# Patient Record
Sex: Male | Born: 1970 | Race: White | Hispanic: No | Marital: Married | Smoking: Never smoker
Health system: Southern US, Community
[De-identification: ages and names within clinical notes are randomized; demographics above are authoritative.]

---

## 2004-02-13 ENCOUNTER — Emergency Department (HOSPITAL_COMMUNITY): Admission: EM | Admit: 2004-02-13 | Discharge: 2004-02-13 | Payer: Self-pay | Admitting: Emergency Medicine

## 2019-12-16 ENCOUNTER — Ambulatory Visit (INDEPENDENT_AMBULATORY_CARE_PROVIDER_SITE_OTHER): Payer: Commercial Managed Care - PPO | Admitting: Family Medicine

## 2019-12-16 ENCOUNTER — Other Ambulatory Visit: Payer: Self-pay

## 2019-12-16 ENCOUNTER — Encounter: Payer: Self-pay | Admitting: Family Medicine

## 2019-12-16 VITALS — BP 118/76 | HR 95 | Temp 97.5°F | Ht 71.0 in | Wt 284.0 lb

## 2019-12-16 DIAGNOSIS — I10 Essential (primary) hypertension: Secondary | ICD-10-CM | POA: Diagnosis not present

## 2019-12-16 DIAGNOSIS — Z0000 Encounter for general adult medical examination without abnormal findings: Secondary | ICD-10-CM | POA: Insufficient documentation

## 2019-12-16 DIAGNOSIS — F320 Major depressive disorder, single episode, mild: Secondary | ICD-10-CM | POA: Diagnosis not present

## 2019-12-16 MED ORDER — LISINOPRIL-HYDROCHLOROTHIAZIDE 20-12.5 MG PO TABS: 1.0000 | ORAL_TABLET | Freq: Two times a day (BID) | ORAL | 3 refills | Status: DC

## 2019-12-16 MED ORDER — FLUOXETINE HCL 40 MG PO CAPS: 40.0000 mg | ORAL_CAPSULE | Freq: Two times a day (BID) | ORAL | 3 refills | Status: DC

## 2019-12-16 NOTE — Progress Notes (Signed)
Patient ID: Dakota Cohen, male    DOB: 11-29-1970, 49 y.o.   MRN: [id number]   Chief Complaint  Patient presents with  . Annual Exam   Subjective:    HPI  The patient comes in today for a wellness visit.    A review of their health history was completed.  A review of medications was also completed.  Any needed refills; prozac, lisinopril-hctz, and new cpap machine  Eating habits: health conscious  Falls/  MVA accidents in past few months: none  Regular exercise: strength 3 days a week, cardio 2 days a week   Specialist pt sees on regular basis: none  Preventative health issues were discussed.   Additional concerns: needs rx for new cpap machine to [place] [facility]   Medical History Dakota Cohen has no past medical history on file.   Outpatient Encounter Medications as of 12/16/2019  Medication Sig  . FLUoxetine (PROZAC) 40 MG capsule Take 1 capsule (40 mg total) by mouth 2 (two) times daily.  Marland Kitchen lisinopril-hydrochlorothiazide (ZESTORETIC) 20-12.5 MG tablet Take 1 tablet by mouth 2 (two) times daily.  . [DISCONTINUED] FLUoxetine (PROZAC) 40 MG capsule Take 40 mg by mouth 2 (two) times daily.  . [DISCONTINUED] lisinopril-hydrochlorothiazide (ZESTORETIC) 20-12.5 MG tablet Take 1 tablet by mouth 2 (two) times daily.   No facility-administered encounter medications on file as of 12/16/2019.     Review of Systems  Constitutional: Negative.   HENT: Negative.   Eyes: Negative.   Respiratory: Negative.        Sleep apnea- uses CPAP.  Cardiovascular: Negative.   Gastrointestinal: Negative.   Endocrine: Negative.   Genitourinary: Negative.   Musculoskeletal: Negative.   Skin: Negative.   Neurological: Negative.   Hematological: Negative.   Psychiatric/Behavioral: Negative.        High stress job.      Vitals BP 118/76   Pulse 95   Temp (!) 97.5 F (36.4 C)   Ht 5\' 11"  (1.803 m)   Wt 284 lb (128.8 kg)   SpO2 97%   BMI 39.61 kg/m   Objective:   Physical  Exam Vitals and nursing note reviewed. Exam conducted with a chaperone present.  Constitutional:      Appearance: Normal appearance.  HENT:     Right Ear: Tympanic membrane normal.     Left Ear: Tympanic membrane normal.  Eyes:     Pupils: Pupils are equal, round, and reactive to light.  Cardiovascular:     Rate and Rhythm: Normal rate and regular rhythm.     Heart sounds: Normal heart sounds.  Pulmonary:     Effort: Pulmonary effort is normal.     Breath sounds: Normal breath sounds.  Abdominal:     General: Bowel sounds are normal.     Palpations: Abdomen is soft.     Tenderness: There is no abdominal tenderness. There is no guarding.  Musculoskeletal:        General: Normal range of motion.  Skin:    General: Skin is warm and dry.  Neurological:     General: No focal deficit present.     Mental Status: She is alert and oriented to person, place, and time.      Assessment and Plan   1. Routine general medical examination at a health care facility - FLUoxetine (PROZAC) 40 MG capsule; Take 1 capsule (40 mg total) by mouth 2 (two) times daily.  Dispense: 60 capsule; Refill: 3 - lisinopril-hydrochlorothiazide (ZESTORETIC) 20-12.5 MG tablet; Take 1  tablet by mouth 2 (two) times daily.  Dispense: 60 tablet; Refill: 3  2. Essential hypertension - lisinopril-hydrochlorothiazide (ZESTORETIC) 20-12.5 MG tablet; Take 1 tablet by mouth 2 (two) times daily.  Dispense: 60 tablet; Refill: 3  3. Depression, major, single episode, mild (HCC) - FLUoxetine (PROZAC) 40 MG capsule; Take 1 capsule (40 mg total) by mouth 2 (two) times daily.  Dispense: 60 capsule; Refill: 3   Dakota Cohen presents today for a wellness exam. He has been managed by the NP at his job for his chronic conditions and he needs to start having his depression, hypertension, and sleep apnea managed by [facility].   He reports his job is high stress and his boss does not have boundaries, and unfortunately he has not been exercising  the past three months.   He brought lab work from his work and this was reviewed with him. The major concerns are his lipid profile.   He will continue to implement lifestyle changes with exercise and follow-up in 3 months. Diet and exercise discussed: information given.   PHQ-9: 9 today- he will continue on his Prozac (max dose) and be consistent with exercise. Blood pressure is well-controlled- he will continue his current regimen. His CPAP machine at home is very old and starting to fail: we will send over a new prescription to his pharmacy.  Goal is to try to lose at least 5% of his body weight through diet and exercise.   Agrees with plan of care discussed today. Understands warning signs to seek further care: any changes in your health that cause you concern, such as shortness of breath or chest pain. Understands to follow-up in 3 months for chronic condition follow-up and to see how successful lifestyle modifications are going.   Dorena Bodo, FNP-C  12/16/2019

## 2019-12-16 NOTE — Patient Instructions (Signed)
Mediterranean Diet A Mediterranean diet refers to food and lifestyle choices that are based on the traditions of countries located on the [place] Sea. This way of eating has been shown to help prevent certain conditions and improve outcomes for people who have chronic diseases, like kidney disease and heart disease. What are tips for following this plan? Lifestyle  Cook and eat meals together with your family, when possible.  Drink enough fluid to keep your urine clear or pale yellow.  Be physically active every day. This includes: ? Aerobic exercise like running or swimming. ? Leisure activities like gardening, walking, or housework.  Get 7-8 hours of sleep each night.  If recommended by your health care provider, drink red wine in moderation. This means 1 glass a day for nonpregnant women and 2 glasses a day for men. A glass of wine equals 5 oz (150 mL). Reading food labels  Check the serving size of packaged foods. For foods such as rice and pasta, the serving size refers to the amount of cooked product, not dry.  Check the total fat in packaged foods. Avoid foods that have saturated fat or trans fats.  Check the ingredients list for added sugars, such as corn syrup.   Shopping  At the grocery store, buy most of your food from the areas near the walls of the store. This includes: ? Fresh fruits and vegetables (produce). ? Grains, beans, nuts, and seeds. Some of these may be available in unpackaged forms or large amounts (in bulk). ? Fresh seafood. ? Poultry and eggs. ? Low-fat dairy products.  Buy whole ingredients instead of prepackaged foods.  Buy fresh fruits and vegetables in-season from local farmers markets.  Buy frozen fruits and vegetables in resealable bags.  If you do not have access to quality fresh seafood, buy precooked frozen shrimp or canned fish, such as tuna, salmon, or sardines.  Buy small amounts of raw or cooked vegetables, salads, or olives from  the deli or salad bar at your store.  Stock your pantry so you always have certain foods on hand, such as olive oil, canned tuna, canned tomatoes, rice, pasta, and beans. Cooking  Cook foods with extra-virgin olive oil instead of using butter or other vegetable oils.  Have meat as a side dish, and have vegetables or grains as your main dish. This means having meat in small portions or adding small amounts of meat to foods like pasta or stew.  Use beans or vegetables instead of meat in common dishes like chili or lasagna.  Experiment with different cooking methods. Try roasting or broiling vegetables instead of steaming or sauteing them.  Add frozen vegetables to soups, stews, pasta, or rice.  Add nuts or seeds for added healthy fat at each meal. You can add these to yogurt, salads, or vegetable dishes.  Marinate fish or vegetables using olive oil, lemon juice, garlic, and fresh herbs. Meal planning  Plan to eat 1 vegetarian meal one day each week. Try to work up to 2 vegetarian meals, if possible.  Eat seafood 2 or more times a week.  Have healthy snacks readily available, such as: ? Vegetable sticks with hummus. ? Greek yogurt. ? Fruit and nut trail mix.  Eat balanced meals throughout the week. This includes: ? Fruit: 2-3 servings a day ? Vegetables: 4-5 servings a day ? Low-fat dairy: 2 servings a day ? Fish, poultry, or lean meat: 1 serving a day ? Beans and legumes: 2 or more servings a week ?   Nuts and seeds: 1-2 servings a day ? Whole grains: 6-8 servings a day ? Extra-virgin olive oil: 3-4 servings a day  Limit red meat and sweets to only a few servings a month   What are my food choices?  Mediterranean diet ? Recommended  Grains: Whole-grain pasta. Brown rice. Bulgar wheat. Polenta. Couscous. Whole-wheat bread. Oatmeal. Quinoa.  Vegetables: Artichokes. Beets. Broccoli. Cabbage. Carrots. Eggplant. Green beans. Chard. Kale. Spinach. Onions. Leeks. Peas. Squash.  Tomatoes. Peppers. Radishes.  Fruits: Apples. Apricots. Avocado. Berries. Bananas. Cherries. Dates. Figs. Grapes. Lemons. Melon. Oranges. Peaches. Plums. Pomegranate.  Meats and other protein foods: Beans. Almonds. Sunflower seeds. Pine nuts. Peanuts. Cod. Salmon. Scallops. Shrimp. Tuna. Tilapia. Clams. Oysters. Eggs.  Dairy: Low-fat milk. Cheese. Greek yogurt.  Beverages: Water. Red wine. Herbal tea.  Fats and oils: Extra virgin olive oil. Avocado oil. Grape seed oil.  Sweets and desserts: Greek yogurt with honey. Baked apples. Poached pears. Trail mix.  Seasoning and other foods: Basil. Cilantro. Coriander. Cumin. Mint. Parsley. Sage. Rosemary. Tarragon. Garlic. Oregano. Thyme. Pepper. Balsalmic vinegar. Tahini. Hummus. Tomato sauce. Olives. Mushrooms. ? Limit these  Grains: Prepackaged pasta or rice dishes. Prepackaged cereal with added sugar.  Vegetables: Deep fried potatoes (french fries).  Fruits: Fruit canned in syrup.  Meats and other protein foods: Beef. Pork. Lamb. Poultry with skin. Hot dogs. Bacon.  Dairy: Ice cream. Sour cream. Whole milk.  Beverages: Juice. Sugar-sweetened soft drinks. Beer. Liquor and spirits.  Fats and oils: Butter. Canola oil. Vegetable oil. Beef fat (tallow). Lard.  Sweets and desserts: Cookies. Cakes. Pies. Candy.  Seasoning and other foods: Mayonnaise. Premade sauces and marinades. The items listed may not be a complete list. Talk with your dietitian about what dietary choices are right for you. Summary  The Mediterranean diet includes both food and lifestyle choices.  Eat a variety of fresh fruits and vegetables, beans, nuts, seeds, and whole grains.  Limit the amount of red meat and sweets that you eat.  Talk with your health care provider about whether it is safe for you to drink red wine in moderation. This means 1 glass a day for nonpregnant women and 2 glasses a day for men. A glass of wine equals 5 oz (150 mL). This information  is not intended to replace advice given to you by your health care provider. Make sure you discuss any questions you have with your health care provider. Document Revised: 11/24/2015 Document Reviewed: 11/17/2015 Elsevier Patient Education  2020 Else[employer] Inc.  

## 2019-12-29 ENCOUNTER — Telehealth: Payer: Self-pay | Admitting: *Deleted

## 2019-12-29 NOTE — Telephone Encounter (Signed)
Patient called stating he is needing a new rx for Cpap and supplies. He states he had a sleep study done about 15 years or so ago and would rather not go through all of that again- he has been paying out of pocket for the supplies and is now needing the machine. [employer].

## 2019-12-29 NOTE — Telephone Encounter (Signed)
1.  Very unlikely it would be covered by insurance.  Most insurance companies want a up-to-date sleep study something that is been done in the past few years. We can write it but typically what I would do in this situation is recommend a new up-to-date sleep study (And also typically office visit to do this or in some cases I would order the sleep study then recommend office visit once his sleep study comes in)

## 2019-12-29 NOTE — Telephone Encounter (Signed)
Can we order this w/o the sleep study?  Thx. Dr. Ladona Ridgel

## 2019-12-29 NOTE — Telephone Encounter (Signed)
Please see note from Dr. Lorin Picket.  Likely won't get covered unless new sleep study.  Does he want to schedule sleep study?  Thx. Dr. Ladona Ridgel

## 2019-12-29 NOTE — Telephone Encounter (Signed)
See below

## 2019-12-29 NOTE — Telephone Encounter (Signed)
This is Autumn's brother so she couldn't open the message but we can always send a script for it, it just will more than likely not be covered by insurance.

## 2019-12-29 NOTE — Telephone Encounter (Signed)
Hi, are we able to get cpap machine/supplies if no record of sleep study from  10-15 yrs ago?  Thx. shelly

## 2019-12-30 NOTE — Telephone Encounter (Signed)
Patient would prefer to get script without sleep study at this point in time. Script on your door to be signed.

## 2019-12-31 NOTE — Telephone Encounter (Signed)
done

## 2019-12-31 NOTE — Telephone Encounter (Signed)
Faxed

## 2020-01-18 NOTE — Telephone Encounter (Signed)
Error

## 2020-03-16 ENCOUNTER — Other Ambulatory Visit: Payer: Self-pay

## 2020-03-16 ENCOUNTER — Ambulatory Visit (INDEPENDENT_AMBULATORY_CARE_PROVIDER_SITE_OTHER): Payer: Commercial Managed Care - PPO | Admitting: Family Medicine

## 2020-03-16 ENCOUNTER — Encounter: Payer: Self-pay | Admitting: Family Medicine

## 2020-03-16 VITALS — BP 136/74 | HR 104 | Temp 97.7°F | Wt 291.8 lb

## 2020-03-16 DIAGNOSIS — F320 Major depressive disorder, single episode, mild: Secondary | ICD-10-CM

## 2020-03-16 DIAGNOSIS — I10 Essential (primary) hypertension: Secondary | ICD-10-CM

## 2020-03-16 DIAGNOSIS — Z0000 Encounter for general adult medical examination without abnormal findings: Secondary | ICD-10-CM | POA: Diagnosis not present

## 2020-03-16 MED ORDER — FLUOXETINE HCL 40 MG PO CAPS: 40.0000 mg | ORAL_CAPSULE | Freq: Two times a day (BID) | ORAL | 5 refills | Status: DC

## 2020-03-16 MED ORDER — LISINOPRIL-HYDROCHLOROTHIAZIDE 20-12.5 MG PO TABS: 1.0000 | ORAL_TABLET | Freq: Two times a day (BID) | ORAL | 5 refills | Status: DC

## 2020-03-16 NOTE — Progress Notes (Signed)
Patient ID: Dakota Cohen, male    DOB: October 02, 1970, 49 y.o.   MRN: [id number]   Chief Complaint  Patient presents with  . Hypertension  . Depression   Subjective:  CC: medication management for high blood pressure and depression   Reports today for routine medication management for hypertension and depression.  Reports things are going well, no health concerns, has a stressful job.  Denies any thoughts of harm.  Denies fever, chills, chest pain, shortness of breath.    Medical History Bird has no past medical history on file.   Outpatient Encounter Medications as of 03/16/2020  Medication Sig  . FLUoxetine (PROZAC) 40 MG capsule Take 1 capsule (40 mg total) by mouth 2 (two) times daily.  Marland Kitchen lisinopril-hydrochlorothiazide (ZESTORETIC) 20-12.5 MG tablet Take 1 tablet by mouth 2 (two) times daily.  . [DISCONTINUED] FLUoxetine (PROZAC) 40 MG capsule Take 1 capsule (40 mg total) by mouth 2 (two) times daily.  . [DISCONTINUED] lisinopril-hydrochlorothiazide (ZESTORETIC) 20-12.5 MG tablet Take 1 tablet by mouth 2 (two) times daily.   No facility-administered encounter medications on file as of 03/16/2020.     Review of Systems  Constitutional: Positive for fatigue. Negative for chills and fever.       Usually fatigue than normal.  HENT: Negative for ear pain.   Respiratory: Negative for cough, chest tightness and shortness of breath.   Cardiovascular: Negative for chest pain, palpitations and leg swelling.  Gastrointestinal: Negative for abdominal pain and blood in stool.  Genitourinary: Negative for difficulty urinating.  Musculoskeletal: Negative for back pain and joint swelling.  Skin: Negative for rash.  Neurological: Negative for dizziness, weakness, numbness and headaches.  Psychiatric/Behavioral: Negative for self-injury and suicidal ideas.     Vitals BP 136/74   Pulse (!) 104   Temp 97.7 F (36.5 C)   Wt 291 lb 12.8 oz (132.4 kg)   SpO2 98%   BMI 40.70 kg/m    Objective:   Physical Exam Vitals and nursing note reviewed.  Constitutional:      Appearance: Normal appearance.  Cardiovascular:     Rate and Rhythm: Normal rate and regular rhythm.     Heart sounds: Normal heart sounds.  Pulmonary:     Effort: Pulmonary effort is normal.     Breath sounds: Normal breath sounds.  Skin:    General: Skin is warm and dry.  Neurological:     Mental Status: He is alert and oriented to person, place, and time.  Psychiatric:        Mood and Affect: Mood normal.        Behavior: Behavior normal.        Thought Content: Thought content normal.      Assessment and Plan   1. Essential hypertension - lisinopril-hydrochlorothiazide (ZESTORETIC) 20-12.5 MG tablet; Take 1 tablet by mouth 2 (two) times daily.  Dispense: 60 tablet; Refill: 5  2. Depression, major, single episode, mild (HCC) - FLUoxetine (PROZAC) 40 MG capsule; Take 1 capsule (40 mg total) by mouth 2 (two) times daily.  Dispense: 60 capsule; Refill: 5  3. Routine general medical examination at a health care facility - lisinopril-hydrochlorothiazide (ZESTORETIC) 20-12.5 MG tablet; Take 1 tablet by mouth 2 (two) times daily.  Dispense: 60 tablet; Refill: 5 - FLUoxetine (PROZAC) 40 MG capsule; Take 1 capsule (40 mg total) by mouth 2 (two) times daily.  Dispense: 60 capsule; Refill: 5   Essential hypertension: Encouraged sodium restriction, exercise, lots of fruits and vegetables.  Compliant  with medication.  Continue current medication regimen.  Depression: PHQ-9 score today 3.  Feels well controlled, no changes to current medication regimen.  Denies any thoughts of self-harm.  Last laboratory work done in August 2021 overall all good.  We will continue to work on weight loss, getting adequate exercise.  Has a stressful occupation, encouraged boundaries, this is not likely.  Agrees with plan of care discussed today. Understands warning signs to seek further care: Chest pain, shortness of  breath, any significant change in health. Understands to follow-up in 6 months for medication management for hypertension and depression, sooner if needed.  Routine medication refills sent.   Dorena Bodo, FNP-C

## 2020-03-16 NOTE — Patient Instructions (Signed)
DASH Eating Plan DASH stands for "Dietary Approaches to Stop Hypertension." The DASH eating plan is a healthy eating plan that has been shown to reduce high blood pressure (hypertension). It may also reduce your risk for type 2 diabetes, heart disease, and stroke. The DASH eating plan may also help with weight loss. What are tips for following this plan?  General guidelines  Avoid eating more than 2,300 mg (milligrams) of salt (sodium) a day. If you have hypertension, you may need to reduce your sodium intake to 1,500 mg a day.  Limit alcohol intake to no more than 1 drink a day for nonpregnant women and 2 drinks a day for men. One drink equals 12 oz of beer, 5 oz of wine, or 1 oz of hard liquor.  Work with your health care provider to maintain a healthy body weight or to lose weight. Ask what an ideal weight is for you.  Get at least 30 minutes of exercise that causes your heart to beat faster (aerobic exercise) most days of the week. Activities may include walking, swimming, or biking.  Work with your health care provider or diet and nutrition specialist (dietitian) to adjust your eating plan to your individual calorie needs. Reading food labels   Check food labels for the amount of sodium per serving. Choose foods with less than 5 percent of the Daily Value of sodium. Generally, foods with less than 300 mg of sodium per serving fit into this eating plan.  To find whole grains, look for the word "whole" as the first word in the ingredient list. Shopping  Buy products labeled as "low-sodium" or "no salt added."  Buy fresh foods. Avoid canned foods and premade or frozen meals. Cooking  Avoid adding salt when cooking. Use salt-free seasonings or herbs instead of table salt or sea salt. Check with your health care provider or pharmacist before using salt substitutes.  Do not fry foods. Cook foods using healthy methods such as baking, boiling, grilling, and broiling instead.  Cook with  heart-healthy oils, such as olive, canola, soybean, or sunflower oil. Meal planning  Eat a balanced diet that includes: ? 5 or more servings of fruits and vegetables each day. At each meal, try to fill half of your plate with fruits and vegetables. ? Up to 6-8 servings of whole grains each day. ? Less than 6 oz of lean meat, poultry, or fish each day. A 3-oz serving of meat is about the same size as a deck of cards. One egg equals 1 oz. ? 2 servings of low-fat dairy each day. ? A serving of nuts, seeds, or beans 5 times each week. ? Heart-healthy fats. Healthy fats called Omega-3 fatty acids are found in foods such as flaxseeds and coldwater fish, like sardines, salmon, and mackerel.  Limit how much you eat of the following: ? Canned or prepackaged foods. ? Food that is high in trans fat, such as fried foods. ? Food that is high in saturated fat, such as fatty meat. ? Sweets, desserts, sugary drinks, and other foods with added sugar. ? Full-fat dairy products.  Do not salt foods before eating.  Try to eat at least 2 vegetarian meals each week.  Eat more home-cooked food and less restaurant, buffet, and fast food.  When eating at a restaurant, ask that your food be prepared with less salt or no salt, if possible. What foods are recommended? The items listed may not be a complete list. Talk with your dietitian about   what dietary choices are best for you. Grains Whole-grain or whole-wheat bread. Whole-grain or whole-wheat pasta. Brown rice. Oatmeal. Quinoa. Bulgur. Whole-grain and low-sodium cereals. Pita bread. Low-fat, low-sodium crackers. Whole-wheat flour tortillas. Vegetables Fresh or frozen vegetables (raw, steamed, roasted, or grilled). Low-sodium or reduced-sodium tomato and vegetable juice. Low-sodium or reduced-sodium tomato sauce and tomato paste. Low-sodium or reduced-sodium canned vegetables. Fruits All fresh, dried, or frozen fruit. Canned fruit in natural juice (without  added sugar). Meat and other protein foods Skinless chicken or turkey. Ground chicken or turkey. Pork with fat trimmed off. Fish and seafood. Egg whites. Dried beans, peas, or lentils. Unsalted nuts, nut butters, and seeds. Unsalted canned beans. Lean cuts of beef with fat trimmed off. Low-sodium, lean deli meat. Dairy Low-fat (1%) or fat-free (skim) milk. Fat-free, low-fat, or reduced-fat cheeses. Nonfat, low-sodium ricotta or cottage cheese. Low-fat or nonfat yogurt. Low-fat, low-sodium cheese. Fats and oils Soft margarine without trans fats. Vegetable oil. Low-fat, reduced-fat, or light mayonnaise and salad dressings (reduced-sodium). Canola, safflower, olive, soybean, and sunflower oils. Avocado. Seasoning and other foods Herbs. Spices. Seasoning mixes without salt. Unsalted popcorn and pretzels. Fat-free sweets. What foods are not recommended? The items listed may not be a complete list. Talk with your dietitian about what dietary choices are best for you. Grains Baked goods made with fat, such as croissants, muffins, or some breads. Dry pasta or rice meal packs. Vegetables Creamed or fried vegetables. Vegetables in a cheese sauce. Regular canned vegetables (not low-sodium or reduced-sodium). Regular canned tomato sauce and paste (not low-sodium or reduced-sodium). Regular tomato and vegetable juice (not low-sodium or reduced-sodium). Pickles. Olives. Fruits Canned fruit in a light or heavy syrup. Fried fruit. Fruit in cream or butter sauce. Meat and other protein foods Fatty cuts of meat. Ribs. Fried meat. Bacon. Sausage. Bologna and other processed lunch meats. Salami. Fatback. Hotdogs. Bratwurst. Salted nuts and seeds. Canned beans with added salt. Canned or smoked fish. Whole eggs or egg yolks. Chicken or turkey with skin. Dairy Whole or 2% milk, cream, and half-and-half. Whole or full-fat cream cheese. Whole-fat or sweetened yogurt. Full-fat cheese. Nondairy creamers. Whipped toppings.  Processed cheese and cheese spreads. Fats and oils Butter. Stick margarine. Lard. Shortening. Ghee. Bacon fat. Tropical oils, such as coconut, palm kernel, or palm oil. Seasoning and other foods Salted popcorn and pretzels. Onion salt, garlic salt, seasoned salt, table salt, and sea salt. Worcestershire sauce. Tartar sauce. Barbecue sauce. Teriyaki sauce. Soy sauce, including reduced-sodium. Steak sauce. Canned and packaged gravies. Fish sauce. Oyster sauce. Cocktail sauce. Horseradish that you find on the shelf. Ketchup. Mustard. Meat flavorings and tenderizers. Bouillon cubes. Hot sauce and Tabasco sauce. Premade or packaged marinades. Premade or packaged taco seasonings. Relishes. Regular salad dressings. Where to find more information:  National Heart, Lung, and Blood Institute: www.nhlbi.nih.gov  American Heart Association: www.heart.org Summary  The DASH eating plan is a healthy eating plan that has been shown to reduce high blood pressure (hypertension). It may also reduce your risk for type 2 diabetes, heart disease, and stroke.  With the DASH eating plan, you should limit salt (sodium) intake to 2,300 mg a day. If you have hypertension, you may need to reduce your sodium intake to 1,500 mg a day.  When on the DASH eating plan, aim to eat more fresh fruits and vegetables, whole grains, lean proteins, low-fat dairy, and heart-healthy fats.  Work with your health care provider or diet and nutrition specialist (dietitian) to adjust your eating plan to your   individual calorie needs. This information is not intended to replace advice given to you by your health care provider. Make sure you discuss any questions you have with your health care provider. Document Revised: 03/08/2017 Document Reviewed: 03/19/2016 Elsevier Patient Education  2020 Elsevier Inc.  

## 2020-09-14 ENCOUNTER — Ambulatory Visit: Payer: Commercial Managed Care - PPO | Admitting: Family Medicine

## 2020-09-26 ENCOUNTER — Other Ambulatory Visit: Payer: Self-pay | Admitting: *Deleted

## 2020-09-26 DIAGNOSIS — I10 Essential (primary) hypertension: Secondary | ICD-10-CM

## 2020-09-26 DIAGNOSIS — Z0000 Encounter for general adult medical examination without abnormal findings: Secondary | ICD-10-CM

## 2020-09-26 DIAGNOSIS — F320 Major depressive disorder, single episode, mild: Secondary | ICD-10-CM

## 2020-09-26 MED ORDER — FLUOXETINE HCL 40 MG PO CAPS: 40.0000 mg | ORAL_CAPSULE | Freq: Two times a day (BID) | ORAL | 0 refills | Status: DC

## 2020-09-26 MED ORDER — LISINOPRIL-HYDROCHLOROTHIAZIDE 20-12.5 MG PO TABS: 1.0000 | ORAL_TABLET | Freq: Two times a day (BID) | ORAL | 0 refills | Status: DC

## 2020-09-27 ENCOUNTER — Other Ambulatory Visit: Payer: Self-pay | Admitting: Family Medicine

## 2020-09-27 DIAGNOSIS — F320 Major depressive disorder, single episode, mild: Secondary | ICD-10-CM

## 2020-09-27 DIAGNOSIS — Z0000 Encounter for general adult medical examination without abnormal findings: Secondary | ICD-10-CM

## 2020-10-12 ENCOUNTER — Other Ambulatory Visit: Payer: Self-pay

## 2020-10-12 ENCOUNTER — Encounter: Payer: Self-pay | Admitting: Family Medicine

## 2020-10-12 ENCOUNTER — Ambulatory Visit (INDEPENDENT_AMBULATORY_CARE_PROVIDER_SITE_OTHER): Payer: Commercial Managed Care - PPO | Admitting: Family Medicine

## 2020-10-12 VITALS — BP 121/80 | HR 86 | Temp 98.2°F | Ht 71.0 in | Wt 270.0 lb

## 2020-10-12 DIAGNOSIS — I10 Essential (primary) hypertension: Secondary | ICD-10-CM

## 2020-10-12 DIAGNOSIS — Z0001 Encounter for general adult medical examination with abnormal findings: Secondary | ICD-10-CM | POA: Diagnosis not present

## 2020-10-12 DIAGNOSIS — F320 Major depressive disorder, single episode, mild: Secondary | ICD-10-CM

## 2020-10-12 DIAGNOSIS — Z0000 Encounter for general adult medical examination without abnormal findings: Secondary | ICD-10-CM

## 2020-10-12 DIAGNOSIS — E782 Mixed hyperlipidemia: Secondary | ICD-10-CM

## 2020-10-12 MED ORDER — FLUOXETINE HCL 40 MG PO CAPS: 40.0000 mg | ORAL_CAPSULE | Freq: Two times a day (BID) | ORAL | 5 refills | Status: DC

## 2020-10-12 MED ORDER — LISINOPRIL-HYDROCHLOROTHIAZIDE 20-12.5 MG PO TABS: 1.0000 | ORAL_TABLET | Freq: Two times a day (BID) | ORAL | 5 refills | Status: DC

## 2020-10-12 MED ORDER — BUPROPION HCL ER (XL) 150 MG PO TB24: 150.0000 mg | ORAL_TABLET | Freq: Every day | ORAL | 0 refills | Status: DC

## 2020-10-12 NOTE — Progress Notes (Signed)
Patient ID: BELL CAI, male    DOB: Sep 15, 1970, 50 y.o.   MRN: [id number]   Chief Complaint  Patient presents with   Annual Exam   Subjective:    HPI The patient comes in today for a wellness visit.  A review of their health history was completed.  A review of medications was also completed.  Any needed refills; yes zestoric and other  Eating habits: fair  Falls/  MVA accidents in past few months: no  Regular exercise: 3 x weekly  Specialist pt sees on regular basis: none  Preventative health issues were discussed.   Additional concerns: anx/ dep med not effective  Taking prozac 40mg  bid and was on wellbutrin in past.  Pt on cpap machine has h/o sleep apnea. Had machine over 10 yrs. Depress and anxiety- feeling meds aren't as effective- Prozac 40mg  bid.  Medical History Dakota Cohen has no past medical history on file.   Outpatient Encounter Medications as of 10/12/2020  Medication Sig   buPROPion (WELLBUTRIN XL) 150 MG 24 hr tablet Take 1 tablet (150 mg total) by mouth daily.   FLUoxetine (PROZAC) 40 MG capsule Take 1 capsule (40 mg total) by mouth 2 (two) times daily.   lisinopril-hydrochlorothiazide (ZESTORETIC) 20-12.5 MG tablet Take 1 tablet by mouth 2 (two) times daily.   [DISCONTINUED] FLUoxetine (PROZAC) 40 MG capsule Take 1 capsule (40 mg total) by mouth 2 (two) times daily.   [DISCONTINUED] lisinopril-hydrochlorothiazide (ZESTORETIC) 20-12.5 MG tablet Take 1 tablet by mouth 2 (two) times daily.   No facility-administered encounter medications on file as of 10/12/2020.     Review of Systems  Constitutional:  Negative for chills and fever.  HENT:  Negative for congestion, rhinorrhea and sore throat.   Respiratory:  Negative for cough, shortness of breath and wheezing.   Cardiovascular:  Negative for chest pain and leg swelling.  Gastrointestinal:  Negative for abdominal pain, diarrhea, nausea and vomiting.  Genitourinary:  Negative for dysuria and  frequency.  Skin:  Negative for rash.  Neurological:  Negative for dizziness, weakness and headaches.  Psychiatric/Behavioral:  Positive for dysphoric mood. Negative for self-injury, sleep disturbance and suicidal ideas. The patient is not nervous/anxious.     Vitals BP 121/80   Pulse 86   Temp 98.2 F (36.8 C)   Ht 5\' 11"  (1.803 m)   Wt 270 lb (122.5 kg)   SpO2 97%   BMI 37.66 kg/m   Objective:   Physical Exam Vitals and nursing note reviewed.  Constitutional:      General: He is not in acute distress.    Appearance: Normal appearance. He is not ill-appearing.  HENT:     Head: Normocephalic.     Nose: Nose normal. No congestion.     Mouth/Throat:     Mouth: Mucous membranes are moist.     Pharynx: No oropharyngeal exudate.  Eyes:     Extraocular Movements: Extraocular movements intact.     Conjunctiva/sclera: Conjunctivae normal.     Pupils: Pupils are equal, round, and reactive to light.  Cardiovascular:     Rate and Rhythm: Normal rate and regular rhythm.     Pulses: Normal pulses.     Heart sounds: Normal heart sounds. No murmur heard. Pulmonary:     Effort: Pulmonary effort is normal.     Breath sounds: Normal breath sounds. No wheezing, rhonchi or rales.  Musculoskeletal:        General: Normal range of motion.  Right lower leg: No edema.     Left lower leg: No edema.  Skin:    General: Skin is warm and dry.     Findings: No rash.  Neurological:     General: No focal deficit present.     Mental Status: He is alert and oriented to person, place, and time.     Cranial Nerves: No cranial nerve deficit.  Psychiatric:        Mood and Affect: Mood normal.        Behavior: Behavior normal.        Thought Content: Thought content normal.        Judgment: Judgment normal.     Assessment and Plan   1. Routine general medical examination at a health care facility  2. Depression, major, single episode, mild (HCC) - buPROPion (WELLBUTRIN XL) 150 MG 24 hr  tablet; Take 1 tablet (150 mg total) by mouth daily.  Dispense: 30 tablet; Refill: 0 - FLUoxetine (PROZAC) 40 MG capsule; Take 1 capsule (40 mg total) by mouth 2 (two) times daily.  Dispense: 60 capsule; Refill: 5  3. Essential hypertension - lisinopril-hydrochlorothiazide (ZESTORETIC) 20-12.5 MG tablet; Take 1 tablet by mouth 2 (two) times daily.  Dispense: 60 tablet; Refill: 5  4. Mixed hyperlipidemia   Depression/anxiety-suboptimal.  cont with prozac 40mg  and will add buproprion 150mg  xl.  Htn- stable. Cont meds. Last labs normal.   Labs showing elevated cholesterol from 5/22. Reviewed diet modifications and increase in exercising. Will recheck on next visit.  Return in about 4 weeks (around 11/09/2020) for phone visit 4 wks- f/u depression.

## 2020-10-30 DIAGNOSIS — E782 Mixed hyperlipidemia: Secondary | ICD-10-CM | POA: Insufficient documentation

## 2020-11-09 ENCOUNTER — Telehealth: Payer: Self-pay | Admitting: Family Medicine

## 2020-11-09 ENCOUNTER — Other Ambulatory Visit: Payer: Self-pay

## 2020-11-09 ENCOUNTER — Telehealth (INDEPENDENT_AMBULATORY_CARE_PROVIDER_SITE_OTHER): Payer: Commercial Managed Care - PPO | Admitting: Family Medicine

## 2020-11-09 DIAGNOSIS — F329 Major depressive disorder, single episode, unspecified: Secondary | ICD-10-CM | POA: Diagnosis not present

## 2020-11-09 MED ORDER — BUPROPION HCL ER (XL) 300 MG PO TB24: 300.0000 mg | ORAL_TABLET | Freq: Every day | ORAL | 0 refills | Status: DC

## 2020-11-09 NOTE — Progress Notes (Signed)
Patient ID: Dakota Cohen, male    DOB: 28-May-1970, 50 y.o.   MRN: [id number]   Virtual Visit via Telephone Note  I connected with Dakota Cohen on 11/09/20 at  1:10 PM EDT by telephone and verified that I am speaking with the correct person using two identifiers.  Location: Patient: home Provider: office   I discussed the limitations, risks, security and privacy concerns of performing an evaluation and management service by telephone and the availability of in person appointments. I also discussed with the patient that there may be a patient responsible charge related to this service. The patient expressed understanding and agreed to proceed.  Chief Complaint  Patient presents with   Depression   Subjective:    HPI Pt following up on depression. Pt states really no change. Provider had stated maybe uping his dose if he was seeing no change.   Pt seen for f/u for depression. Pt taking prozac 40mg  bid.  Has been on this for a few years. Pt was seeing the NP at his work for this medication for years prior to coming to . And started on wellbutrin 150mg  xl daily. Pt stating not seeing a change in feeling down or depression.  Not noticing any improvement. Not having any concerns of feeling more down.  Not feeling going to self harm or SI.  Medical History Jaquane has no past medical history on file.   Outpatient Encounter Medications as of 11/09/2020  Medication Sig   buPROPion (WELLBUTRIN XL) 300 MG 24 hr tablet Take 1 tablet (300 mg total) by mouth daily.   FLUoxetine (PROZAC) 40 MG capsule Take 1 capsule (40 mg total) by mouth 2 (two) times daily.   lisinopril-hydrochlorothiazide (ZESTORETIC) 20-12.5 MG tablet Take 1 tablet by mouth 2 (two) times daily.   [DISCONTINUED] buPROPion (WELLBUTRIN XL) 150 MG 24 hr tablet Take 1 tablet (150 mg total) by mouth daily.   No facility-administered encounter medications on file as of 11/09/2020.     Review of Systems   Constitutional:  Negative for chills and fever.  HENT:  Negative for congestion, rhinorrhea and sore throat.   Respiratory:  Negative for cough, shortness of breath and wheezing.   Cardiovascular:  Negative for chest pain and leg swelling.  Gastrointestinal:  Negative for abdominal pain, diarrhea, nausea and vomiting.  Genitourinary:  Negative for dysuria and frequency.  Skin:  Negative for rash.  Neurological:  Negative for dizziness, weakness and headaches.  Psychiatric/Behavioral:  Positive for dysphoric mood. Negative for self-injury, sleep disturbance and suicidal ideas. The patient is not nervous/anxious.     Vitals There were no vitals taken for this visit.  Objective:   Physical Exam No PE due to phone visit.   Assessment and Plan   1. Major depressive disorder with current active episode, unspecified depression episode severity, unspecified whether recurrent   Depression-suboptimal.  Today increased the 150mg  wellbutrin to 300mg  xl and will f/u in 4 wks.  If not improving may recommend f/u with psychiatrist.   Return in about 4 weeks (around 12/07/2020) for f/u depression.    Follow Up Instructions:    I discussed the assessment and treatment plan with the patient. The patient was provided an opportunity to ask questions and all were answered. The patient agreed with the plan and demonstrated an understanding of the instructions.   The patient was advised to call back or seek an in-person evaluation if the symptoms worsen or if the condition fails to improve  as anticipated.  I provided  15 minutes of non-face-to-face time during this encounter.

## 2020-11-09 NOTE — Telephone Encounter (Signed)
[name], rallis are scheduled for a virtual visit with your provider today.    Just as we do with appointments in the office, we must obtain your consent to participate.  Your consent will be active for this visit and any virtual visit you may have with one of our providers in the next 365 days.    If you have a MyChart account, I can also send a copy of this consent to you electronically.  All virtual visits are billed to your insurance company just like a traditional visit in the office.  As this is a virtual visit, video technology does not allow for your provider to perform a traditional examination.  This may limit your provider's ability to fully assess your condition.  If your provider identifies any concerns that need to be evaluated in person or the need to arrange testing such as labs, EKG, etc, we will make arrangements to do so.    Although advances in technology are sophisticated, we cannot ensure that it will always work on either your end or our end.  If the connection with a video visit is poor, we may have to switch to a telephone visit.  With either a video or telephone visit, we are not always able to ensure that we have a secure connection.   I need to obtain your verbal consent now.   Are you willing to proceed with your visit today?   Dakota Cohen has provided verbal consent on 11/09/2020 for a virtual visit (video or telephone).   Marlowe Shores, LPN 04/14/1094  04:54 AM

## 2020-12-07 ENCOUNTER — Other Ambulatory Visit: Payer: Self-pay

## 2020-12-07 ENCOUNTER — Telehealth (INDEPENDENT_AMBULATORY_CARE_PROVIDER_SITE_OTHER): Payer: Commercial Managed Care - PPO | Admitting: Family Medicine

## 2020-12-07 ENCOUNTER — Telehealth: Payer: Self-pay | Admitting: Family Medicine

## 2020-12-07 DIAGNOSIS — F320 Major depressive disorder, single episode, mild: Secondary | ICD-10-CM

## 2020-12-07 DIAGNOSIS — I10 Essential (primary) hypertension: Secondary | ICD-10-CM

## 2020-12-07 DIAGNOSIS — Z5329 Procedure and treatment not carried out because of patient's decision for other reasons: Secondary | ICD-10-CM

## 2020-12-07 MED ORDER — BUPROPION HCL ER (XL) 300 MG PO TB24: 300.0000 mg | ORAL_TABLET | Freq: Every day | ORAL | 2 refills | Status: DC

## 2020-12-07 MED ORDER — LISINOPRIL-HYDROCHLOROTHIAZIDE 20-12.5 MG PO TABS: 1.0000 | ORAL_TABLET | Freq: Two times a day (BID) | ORAL | 2 refills | Status: DC

## 2020-12-07 MED ORDER — FLUOXETINE HCL 40 MG PO CAPS: 40.0000 mg | ORAL_CAPSULE | Freq: Two times a day (BID) | ORAL | 2 refills | Status: DC

## 2020-12-07 NOTE — Progress Notes (Signed)
Pt unavailable for appt.

## 2020-12-22 NOTE — Telephone Encounter (Signed)
error 

## 2021-01-13 ENCOUNTER — Encounter: Payer: Self-pay | Admitting: Nurse Practitioner

## 2021-01-13 ENCOUNTER — Ambulatory Visit (INDEPENDENT_AMBULATORY_CARE_PROVIDER_SITE_OTHER): Payer: Commercial Managed Care - PPO | Admitting: Nurse Practitioner

## 2021-01-13 ENCOUNTER — Other Ambulatory Visit: Payer: Self-pay

## 2021-01-13 VITALS — BP 134/82 | Temp 97.3°F | Wt 290.2 lb

## 2021-01-13 DIAGNOSIS — R109 Unspecified abdominal pain: Secondary | ICD-10-CM

## 2021-01-13 DIAGNOSIS — R140 Abdominal distension (gaseous): Secondary | ICD-10-CM | POA: Diagnosis not present

## 2021-01-13 LAB — POCT URINALYSIS DIPSTICK
Spec Grav, UA: 1.01 (ref 1.010–1.025)
pH, UA: 6.5 (ref 5.0–8.0)

## 2021-01-13 NOTE — Progress Notes (Signed)
   Subjective:    Patient ID: Dakota Cohen, male    DOB: April 15, 1970, 50 y.o.   MRN: [id number]  HPI Pt having right side abdominal swelling and some nagging pain. Noticed about one week ago. No n/v/d. Does radiate to lower back on right side. No urinary issues. Pt has tried Aleve. No injury known. Possible pulled muscle.    Review of Systems     Objective:   Physical Exam       Assessment & Plan:

## 2021-01-13 NOTE — Progress Notes (Signed)
Subjective:    Patient ID: Dakota Cohen, male    DOB: January 18, 1971, 50 y.o.   MRN: [id number]  Abdominal Pain Pertinent negatives include no constipation, diarrhea, fever, nausea or vomiting.  Patient presents today for a right upper abdominal and swelling that started 1 week ago and describes it as a nagging pain that comes and goes but partially alleviated by Aleve. Denies any injuries to area, diarrhea, constipation, blood in stool, n/v. Denies any trouble with urinating, starting stream, burning or blood in urine. Married, same sexual partner. Says sometimes pain radiates to the right mid back area. His current pain level is between 1 and 2 out of 10. No history of injury. No rash. Does not interrupt sleep. Unassociated with movement. Has not identified any aggravating factors.  No previous surgeries. No history of GI problems. No change in appetite. Taking fluids well.   Review of Systems  Constitutional:  Negative for chills and fever.  Respiratory:  Negative for shortness of breath.   Cardiovascular:  Negative for chest pain.  Gastrointestinal:  Positive for abdominal pain. Negative for blood in stool, constipation, diarrhea, nausea and vomiting.  Genitourinary:  Positive for flank pain. Negative for difficulty urinating and urgency.       Occasional radiation into the right flank area.       Objective:   Physical Exam Cardiovascular:     Rate and Rhythm: Normal rate and regular rhythm.     Heart sounds: Normal heart sounds.  Pulmonary:     Effort: Pulmonary effort is normal.     Breath sounds: Normal breath sounds.  Abdominal:     General: Bowel sounds are normal. There is distension.     Tenderness: There is no guarding or rebound.     Comments: Obvious distension along the right mid/lateral abdomen as compared to the left. No discoloration. No tenderness or obvious masses with deep palpation. Exam limited due to distension and abdominal girth. No CVA tenderness.   Skin:     General: Skin is warm and dry.  Neurological:     Mental Status: He is alert and oriented to person, place, and time.  Psychiatric:        Mood and Affect: Mood normal.        Behavior: Behavior normal.    . Vitals:   01/13/21 1130  BP: 134/82  Temp: (!) 97.3 F (36.3 C)  Weight: 290 lb 3.2 oz (131.6 kg)   Results for orders placed or performed in visit on 01/13/21  POCT Urinalysis Dipstick  Result Value Ref Range   Color, UA     Clarity, UA     Glucose, UA     Bilirubin, UA     Ketones, UA +    Spec Grav, UA 1.010 1.010 - 1.025   Blood, UA     pH, UA 6.5 5.0 - 8.0   Protein, UA     Urobilinogen, UA     Nitrite, UA     Leukocytes, UA     Appearance     Odor           Assessment & Plan:  Abdominal pain, unspecified abdominal location - Plan: POCT Urinalysis Dipstick, Comprehensive Metabolic Panel (CMET), CBC with Differential, Lipase, CT ABDOMEN W CONTRAST  Abdominal distension  Plan -CBC, CMP, Lipase pending.  -Referral for CT scan for further evaluation.  -Discussed warning signs such as persistent fever, chills, n/v, abdominal pain, blood in stool or urine, difficulty in urinating,  go to ER immediately.  Further follow up based on test results.

## 2021-01-14 ENCOUNTER — Encounter: Payer: Self-pay | Admitting: Nurse Practitioner

## 2021-01-14 LAB — COMPREHENSIVE METABOLIC PANEL
ALT: 48 IU/L — ABNORMAL HIGH (ref 0–44)
AST: 28 IU/L (ref 0–40)
Albumin/Globulin Ratio: 2.1 (ref 1.2–2.2)
Albumin: 4.5 g/dL (ref 4.0–5.0)
Alkaline Phosphatase: 61 IU/L (ref 44–121)
BUN/Creatinine Ratio: 15 (ref 9–20)
BUN: 12 mg/dL (ref 6–24)
Bilirubin Total: 0.3 mg/dL (ref 0.0–1.2)
CO2: 22 mmol/L (ref 20–29)
Calcium: 9.4 mg/dL (ref 8.7–10.2)
Chloride: 101 mmol/L (ref 96–106)
Creatinine, Ser: 0.82 mg/dL (ref 0.76–1.27)
Globulin, Total: 2.1 g/dL (ref 1.5–4.5)
Glucose: 79 mg/dL (ref 70–99)
Potassium: 3.9 mmol/L (ref 3.5–5.2)
Sodium: 139 mmol/L (ref 134–144)
Total Protein: 6.6 g/dL (ref 6.0–8.5)
eGFR: 108 mL/min/{1.73_m2} (ref >59)

## 2021-01-14 LAB — CBC WITH DIFFERENTIAL/PLATELET
Basophils Absolute: 0 10*3/uL (ref 0.0–0.2)
Basos: 0 %
EOS (ABSOLUTE): 0.4 10*3/uL (ref 0.0–0.4)
Eos: 4 %
Hematocrit: 44.7 % (ref 37.5–51.0)
Hemoglobin: 14.9 g/dL (ref 13.0–17.7)
Immature Grans (Abs): 0 10*3/uL (ref 0.0–0.1)
Immature Granulocytes: 0 %
Lymphocytes Absolute: 3 10*3/uL (ref 0.7–3.1)
Lymphs: 29 %
MCH: 30.9 pg (ref 26.6–33.0)
MCHC: 33.3 g/dL (ref 31.5–35.7)
MCV: 93 fL (ref 79–97)
Monocytes Absolute: 0.9 10*3/uL (ref 0.1–0.9)
Monocytes: 9 %
Neutrophils Absolute: 6.2 10*3/uL (ref 1.4–7.0)
Neutrophils: 58 %
Platelets: 288 10*3/uL (ref 150–450)
RBC: 4.82 x10E6/uL (ref 4.14–5.80)
RDW: 12.6 % (ref 11.6–15.4)
WBC: 10.6 10*3/uL (ref 3.4–10.8)

## 2021-01-14 LAB — LIPASE: Lipase: 14 U/L (ref 13–78)

## 2021-02-14 ENCOUNTER — Encounter (HOSPITAL_COMMUNITY): Payer: Self-pay | Admitting: Radiology

## 2021-02-15 ENCOUNTER — Ambulatory Visit (HOSPITAL_COMMUNITY)
Admission: RE | Admit: 2021-02-15 | Discharge: 2021-02-15 | Disposition: A | Discharge status: Home / Self Care | Payer: Commercial Managed Care - PPO | Source: Ambulatory Visit | Attending: Nurse Practitioner | Admitting: Nurse Practitioner

## 2021-02-15 ENCOUNTER — Other Ambulatory Visit: Payer: Self-pay

## 2021-02-15 DIAGNOSIS — R109 Unspecified abdominal pain: Secondary | ICD-10-CM | POA: Insufficient documentation

## 2021-02-15 MED ORDER — IOHEXOL 300 MG/ML  SOLN
100.0000 mL | Freq: Once | INTRAMUSCULAR | Status: AC | PRN
  Administered 2021-02-15: 100 mL via INTRAVENOUS

## 2021-02-20 LAB — POCT I-STAT CREATININE: Creatinine, Ser: 1 mg/dL (ref 0.61–1.24)

## 2021-03-13 ENCOUNTER — Telehealth (INDEPENDENT_AMBULATORY_CARE_PROVIDER_SITE_OTHER): Payer: Commercial Managed Care - PPO | Admitting: Nurse Practitioner

## 2021-03-13 DIAGNOSIS — F320 Major depressive disorder, single episode, mild: Secondary | ICD-10-CM

## 2021-03-13 MED ORDER — BUPROPION HCL ER (XL) 300 MG PO TB24: 300.0000 mg | ORAL_TABLET | Freq: Every day | ORAL | 0 refills | Status: DC

## 2021-03-13 NOTE — Telephone Encounter (Signed)
Patient is requesting refill bupropion 300 mg called into [facility] last seen 10/7//22. Also asking for prescription for a new C pap mask called into [place]

## 2021-03-13 NOTE — Telephone Encounter (Signed)
Please advise. Thank you

## 2021-03-14 NOTE — Telephone Encounter (Signed)
Mychart message sent to patient.

## 2021-03-28 ENCOUNTER — Encounter: Payer: Self-pay | Admitting: Nurse Practitioner

## 2021-03-28 ENCOUNTER — Other Ambulatory Visit: Payer: Self-pay

## 2021-03-28 ENCOUNTER — Ambulatory Visit (INDEPENDENT_AMBULATORY_CARE_PROVIDER_SITE_OTHER): Payer: Commercial Managed Care - PPO | Admitting: Nurse Practitioner

## 2021-03-28 VITALS — BP 126/80 | HR 60 | Temp 99.2°F | Ht 71.0 in | Wt 297.2 lb

## 2021-03-28 DIAGNOSIS — F320 Major depressive disorder, single episode, mild: Secondary | ICD-10-CM

## 2021-03-28 DIAGNOSIS — E782 Mixed hyperlipidemia: Secondary | ICD-10-CM

## 2021-03-28 DIAGNOSIS — I10 Essential (primary) hypertension: Secondary | ICD-10-CM

## 2021-03-28 MED ORDER — LISINOPRIL-HYDROCHLOROTHIAZIDE 20-12.5 MG PO TABS: 2.0000 | ORAL_TABLET | Freq: Every day | ORAL | 2 refills | Status: DC

## 2021-03-28 MED ORDER — BUPROPION HCL ER (XL) 300 MG PO TB24: 300.0000 mg | ORAL_TABLET | Freq: Every day | ORAL | 2 refills | Status: DC

## 2021-03-28 MED ORDER — FLUOXETINE HCL 40 MG PO CAPS: 40.0000 mg | ORAL_CAPSULE | Freq: Two times a day (BID) | ORAL | 2 refills | Status: DC

## 2021-03-28 NOTE — Progress Notes (Signed)
Subjective:    Patient ID: Dakota Cohen, male    DOB: 1970-04-27, 50 y.o.   MRN: [id number]  HPI Anxiety/Depression Patient here for medication refill. Patient takes Fluoxetine and Wellbutrin XL for depression/anxiety. Patient states that he works in a highly stressful job and the medications have helped him to cope. Patient looking forward to retiring in 2-3 years. Patient states that he is doing well on them and would like to continue. No desire to hurt self or anyone else.   HTN BP is at goal of 130/80. BP today was 126/80. Patient has no complaints. Denies any headaches, chest pain, palpitations, changes to vision.   HLD Patient has question regarding his cholesterol. Patient says found out that he has high cholesterol and was going to be put on medication per last doctor he seen.   Patient requesting medication refills until his next appointment in July 2023.   Review of Systems  Constitutional:  Negative for chills, fatigue and fever.  HENT: Negative.    Respiratory: Negative.    Cardiovascular: Negative.   Gastrointestinal: Negative.   Neurological: Negative.   Psychiatric/Behavioral: Negative.  Negative for agitation, behavioral problems, confusion, decreased concentration, self-injury, sleep disturbance and suicidal ideas. The patient is not nervous/anxious and is not hyperactive.       Objective:   Physical Exam Constitutional:      General: He is not in acute distress.    Appearance: Normal appearance. He is obese. He is not ill-appearing or toxic-appearing.  HENT:     Head: Normocephalic.  Cardiovascular:     Rate and Rhythm: Normal rate and regular rhythm.     Pulses: Normal pulses.     Heart sounds: Normal heart sounds. No murmur heard. Pulmonary:     Effort: Pulmonary effort is normal. No respiratory distress.     Breath sounds: Normal breath sounds. No wheezing.  Musculoskeletal:        General: No swelling. Normal range of motion.     Cervical back:  Normal range of motion and neck supple.     Right lower leg: No edema.     Left lower leg: No edema.  Skin:    General: Skin is warm.  Neurological:     General: No focal deficit present.     Mental Status: He is alert and oriented to person, place, and time.  Psychiatric:        Mood and Affect: Mood normal.        Behavior: Behavior normal.          Assessment & Plan:   1. Essential hypertension - BP today 126/80. GOAL 130/80 - lisinopril-hydrochlorothiazide (ZESTORETIC) 20-12.5 MG tablet; Take 2 tablets by mouth daily.  Dispense: 180 tablet; Refill: 2 - Continue taking medication. - Follow up in ~7 months for wellness exam.  - Eat well balanced diet with plenty of lean meats and vegetables and exercise for 30 minutes for 3-5x a week.   2. Depression, major, single episode, mild (HCC) - Patient tolerating medication well.  - FLUoxetine (PROZAC) 40 MG capsule; Take 1 capsule (40 mg total) by mouth 2 (two) times daily.  Dispense: 180 capsule; Refill: 2 - buPROPion (WELLBUTRIN XL) 300 MG 24 hr tablet; Take 1 tablet (300 mg total) by mouth daily.  Dispense: 90 tablet; Refill: 2 - Discussed stress management - Patient looking forward to retiring in 2-3 years. - No desire to hurt self or anyone else. - PHQ-9 score 11 today. Improved from a  score of 17 in July 2022  3. Mixed hyperlipidemia - 10 year ASCVD risk Score 4.7% today.  - Statin not recommended at this time.  - Discussed importance of lifestyle changes. - Eat well balanced diet with plenty of lean meats and vegetables and exercise for 30 minutes for 3-5x a week.  - Will recheck lipid panel as well as other labs in July during wellness visit.

## 2022-01-25 ENCOUNTER — Other Ambulatory Visit: Payer: Self-pay | Admitting: Nurse Practitioner

## 2022-01-25 ENCOUNTER — Ambulatory Visit (INDEPENDENT_AMBULATORY_CARE_PROVIDER_SITE_OTHER): Payer: Commercial Managed Care - PPO | Admitting: Nurse Practitioner

## 2022-01-25 VITALS — BP 113/74 | HR 95 | Temp 97.7°F | Ht 71.0 in | Wt 301.0 lb

## 2022-01-25 DIAGNOSIS — Z0000 Encounter for general adult medical examination without abnormal findings: Secondary | ICD-10-CM | POA: Diagnosis not present

## 2022-01-25 DIAGNOSIS — I10 Essential (primary) hypertension: Secondary | ICD-10-CM

## 2022-01-25 DIAGNOSIS — F320 Major depressive disorder, single episode, mild: Secondary | ICD-10-CM | POA: Diagnosis not present

## 2022-01-25 DIAGNOSIS — E782 Mixed hyperlipidemia: Secondary | ICD-10-CM

## 2022-01-25 MED ORDER — LISINOPRIL-HYDROCHLOROTHIAZIDE 20-12.5 MG PO TABS: 2.0000 | ORAL_TABLET | Freq: Every day | ORAL | 1 refills | Status: DC

## 2022-01-25 MED ORDER — FLUOXETINE HCL 40 MG PO CAPS: 40.0000 mg | ORAL_CAPSULE | Freq: Two times a day (BID) | ORAL | 1 refills | Status: DC

## 2022-01-25 NOTE — Progress Notes (Signed)
Subjective:    Patient ID: Dakota Cohen, male    DOB: 12-Sep-1970, 51 y.o.   MRN: [id number]  HPI  The patient comes in today for a wellness visit.    A review of their health history was completed.  A review of medications was also completed.  Any needed refills; fluoxetine  Eating habits: good  Falls/  MVA accidents in past few months: no  Regular exercise: 3 x week tredmill, weights  Specialist pt sees on regular basis: none  Preventative health issues were discussed.   Additional concerns: weight loss meds, copy of AVS for work related health monitoring BMI   Review of Systems  All other systems reviewed and are negative.      Objective:   Physical Exam Vitals reviewed.  Constitutional:      General: He is not in acute distress.    Appearance: Normal appearance. He is normal weight. He is not ill-appearing, toxic-appearing or diaphoretic.  HENT:     Head: Normocephalic and atraumatic.  Cardiovascular:     Rate and Rhythm: Normal rate and regular rhythm.     Pulses: Normal pulses.     Heart sounds: Normal heart sounds. No murmur heard. Pulmonary:     Effort: Pulmonary effort is normal. No respiratory distress.     Breath sounds: Normal breath sounds. No wheezing.  Musculoskeletal:     Cervical back: Normal range of motion and neck supple. No rigidity or tenderness.     Comments: Grossly intact  Lymphadenopathy:     Cervical: No cervical adenopathy.  Skin:    General: Skin is warm.     Capillary Refill: Capillary refill takes less than 2 seconds.  Neurological:     Mental Status: He is alert.     Comments: Grossly intact  Psychiatric:        Mood and Affect: Mood normal.        Behavior: Behavior normal.           Assessment & Plan:   1. Wellness examination Adult wellness-complete.wellness physical was conducted today. Importance of diet and exercise were discussed in detail.  Importance of stress reduction and healthy living were  discussed.  In addition to this a discussion regarding safety was also covered.  We also reviewed over immunizations and gave recommendations regarding current immunization needed for age.   In addition to this additional areas were also touched on including: Preventative health exams needed:  Colonoscopy patient declined COVID vaccine patient declined Tetanus vaccine patient declined Hep C and HIV screening patient declined.   Patient was advised yearly wellness exam   2. Depression, major, single episode, mild (HCC) -Refilled at max dose - FLUoxetine (PROZAC) 40 MG capsule; Take 1 capsule (40 mg total) by mouth 2 (two) times daily.  Dispense: 180 capsule; Refill: 1 -Denies thoughts of wanting to hurt self or anyone else -Follow-up in 6 months  3. Essential hypertension -Blood pressure today 113/74.  Blood pressure goal of less than 140/90 met. -Refill: - lisinopril-hydrochlorothiazide (ZESTORETIC) 20-12.5 MG tablet; Take 2 tablets by mouth daily.  Dispense: 180 tablet; Refill: 1  4.  Hyperlipidemia -Last LDL 175. -ASCVD 10 year risk score 5.7% -Patient declines offer to start taking a statin. -Patient rather continue with lifestyle changes -Continue to work out 3 times to 5 times a week for 30 minutes at a time.  Patient currently taking and a lot of protein and red meats.  Decrease protein intake and increase vegetable and fruit  intake.  Follow-up in 6 months  Note:  This document was prepared using Dragon voice recognition software and may include unintentional dictation errors. Note - This record has been created using [employer].  Chart creation errors have been sought, but may not always  have been located. Such creation errors do not reflect on  the standard of medical care.

## 2022-01-29 ENCOUNTER — Encounter: Payer: Self-pay | Admitting: Nurse Practitioner

## 2022-07-10 ENCOUNTER — Telehealth: Payer: Self-pay

## 2022-07-10 NOTE — Telephone Encounter (Signed)
Pt needs new RX for Cpap mask sent to [facility] he said if nurse does not call to let Mariann Laster at [facility] call to let him know when he can pick up  Call back [phone]

## 2022-07-11 NOTE — Telephone Encounter (Signed)
Script pending signature for Cpap mask.

## 2022-07-17 NOTE — Telephone Encounter (Signed)
Signed and faxed to [facility]

## 2022-07-30 ENCOUNTER — Telehealth: Payer: Self-pay | Admitting: Family Medicine

## 2022-07-30 DIAGNOSIS — F320 Major depressive disorder, single episode, mild: Secondary | ICD-10-CM

## 2022-07-30 NOTE — Telephone Encounter (Signed)
Refill on FLUoxetine (PROZAC) 40 MG capsule  [facility]

## 2022-07-31 MED ORDER — FLUOXETINE HCL 40 MG PO CAPS: 40.0000 mg | ORAL_CAPSULE | Freq: Two times a day (BID) | ORAL | 0 refills | Status: DC

## 2022-07-31 NOTE — Telephone Encounter (Signed)
Patient made aware per drs notes. 

## 2022-08-15 ENCOUNTER — Other Ambulatory Visit: Payer: Self-pay

## 2022-08-15 ENCOUNTER — Telehealth: Payer: Self-pay

## 2022-08-15 DIAGNOSIS — I10 Essential (primary) hypertension: Secondary | ICD-10-CM

## 2022-08-15 MED ORDER — LISINOPRIL-HYDROCHLOROTHIAZIDE 20-12.5 MG PO TABS: 2.0000 | ORAL_TABLET | Freq: Every day | ORAL | 1 refills | Status: DC

## 2022-08-15 NOTE — Telephone Encounter (Signed)
Prescription Request  08/15/2022  LOV: Visit date not found  What is the name of the medication or equipment? lisinopril-hydrochlorothiazide (ZESTORETIC) 20-12.5 MG tablet   Have you contacted your pharmacy to request a refill? Yes   Which pharmacy would you like this sent to?  [facility] [address] [place] - [address] [place] [address] Phone: [phone] Fax: [phone]    Patient notified that their request is being sent to the clinical staff for review and that they should receive a response within 2 business days.   Please advise at Mobile [phone] (mobile)

## 2022-08-17 IMAGING — CT CT ABDOMEN W/ CM
3 of 5 series · 16 of 46 positions shown, 18 images · IV contrast (omnipaque)
Comparison: None.

CLINICAL DATA: Right upper quadrant abdominal pain over the last 2
months.

EXAM:
CT ABDOMEN WITH CONTRAST
TECHNIQUE: Multidetector CT imaging of the abdomen was performed using the
standard protocol following bolus administration of intravenous
contrast.
CONTRAST:  100mL OMNIPAQUE IOHEXOL 300 MG/ML  SOLN

[Series 3: axial st · axial · 0.86mm/px · z∈[+877,+1172]mm · 11 of 71 slices shown, 13 images]
[im 6/71  soft-tissue]
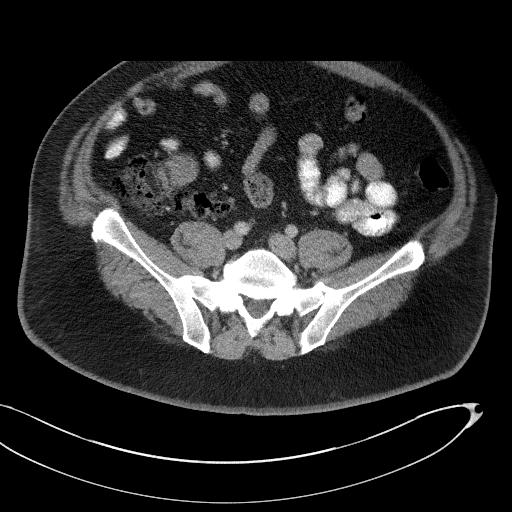
[im 6/71  bone]
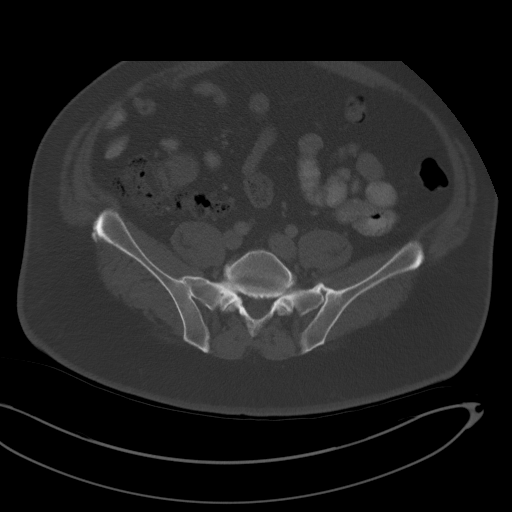
[im 12/71  soft-tissue]
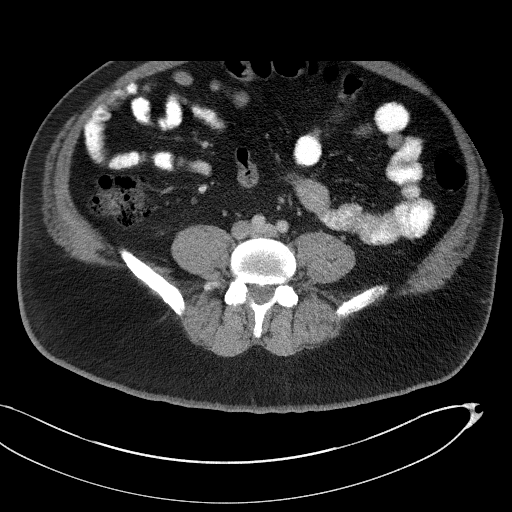
[im 18/71  soft-tissue]
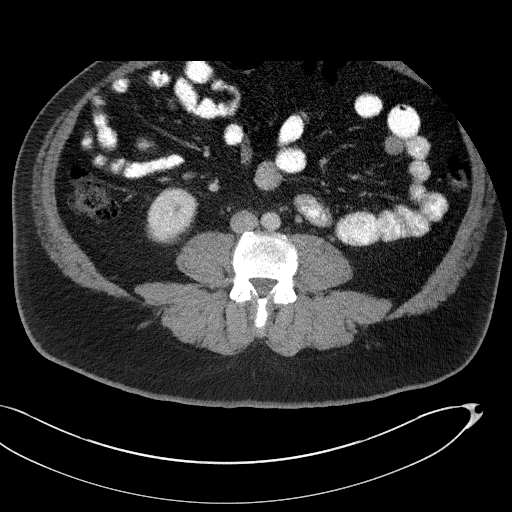
[im 24/71  soft-tissue]
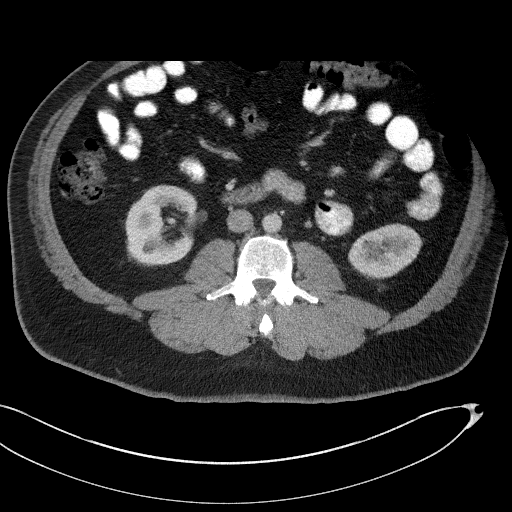
[im 30/71  soft-tissue]
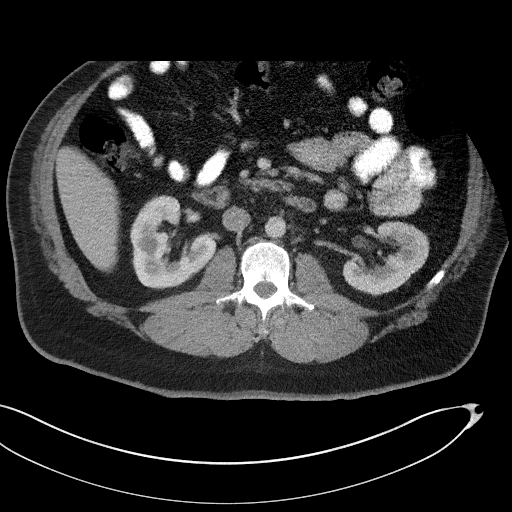
[im 36/71  soft-tissue]
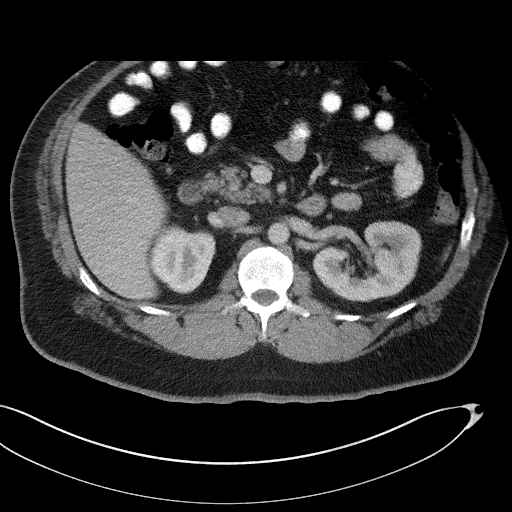
[im 41/71  soft-tissue]
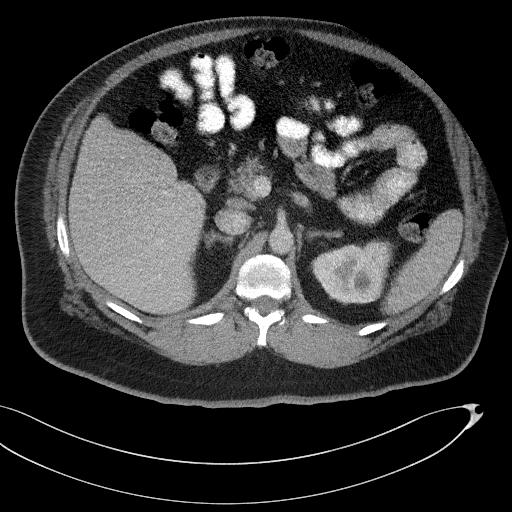
[im 47/71  soft-tissue]
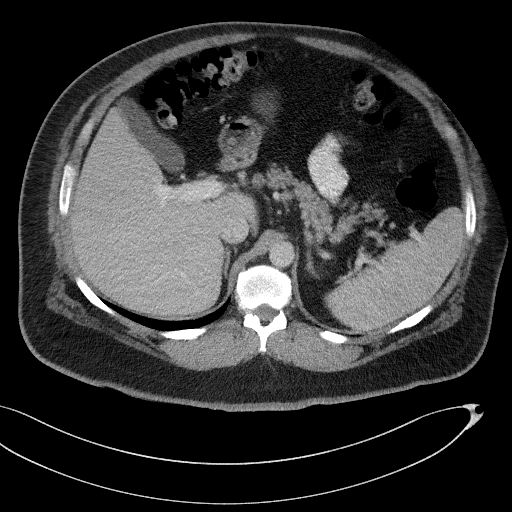
[im 53/71  soft-tissue]
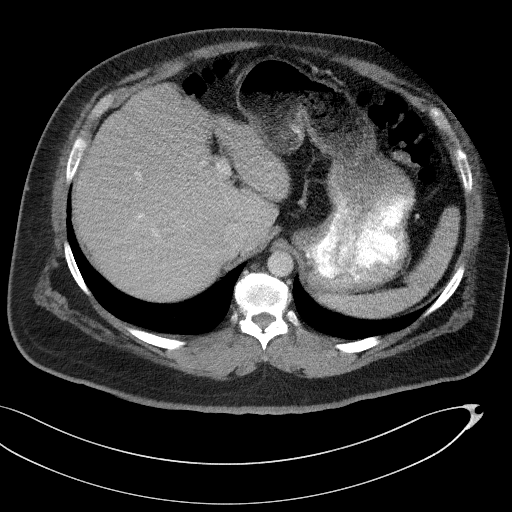
[im 53/71  bone]
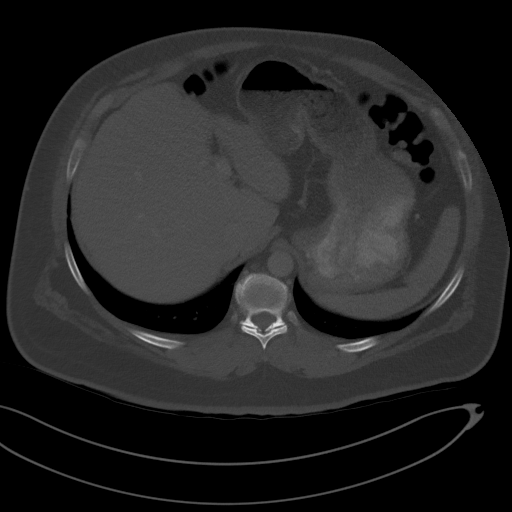
[im 59/71  soft-tissue]
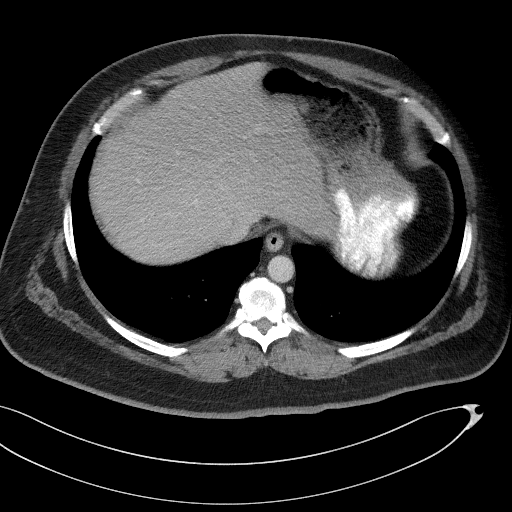
[im 65/71  soft-tissue]
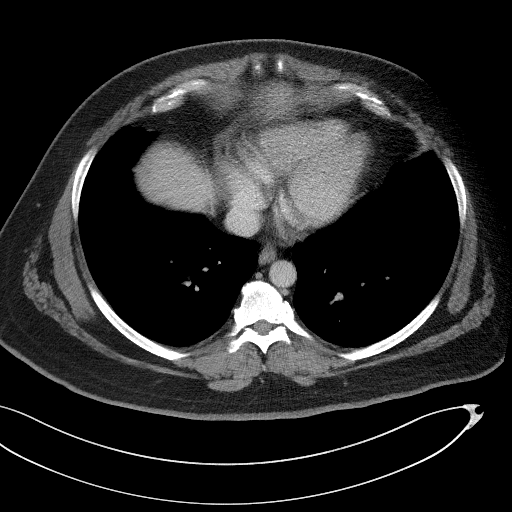

[Series 5: lung bases · axial · 0.86mm/px · z∈[+1072,+1102]mm · 2 of 33 slices shown]
[im 7/33  bone]
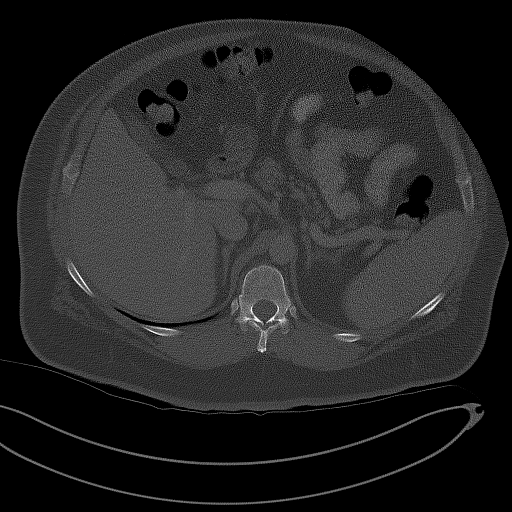
[im 13/33  bone]
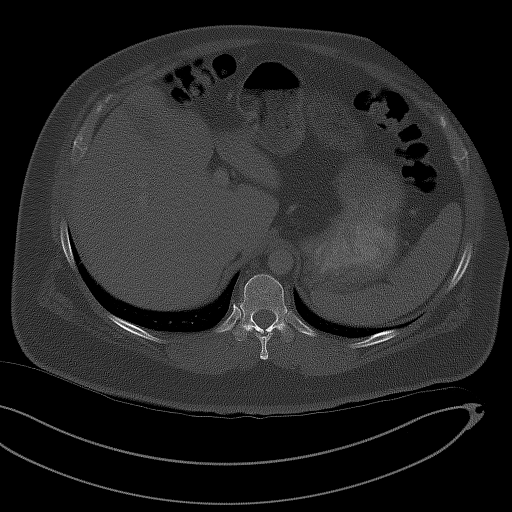

[Series 6: coronal st · coronal · 0.69mm/px · 3 of 123 slices shown]
[im 41/123  soft-tissue]
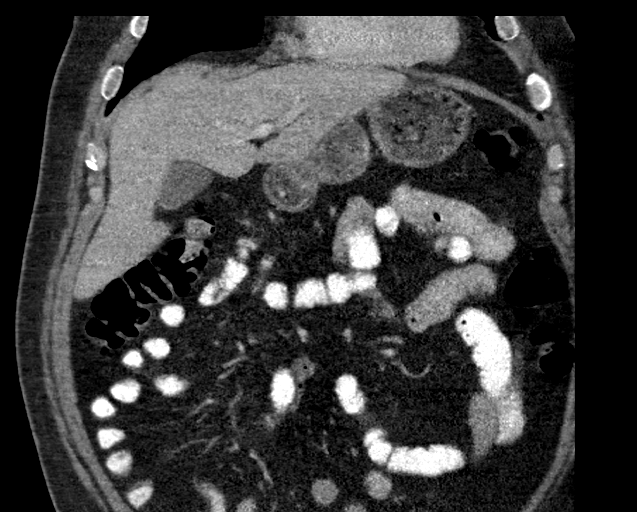
[im 55/123  soft-tissue]
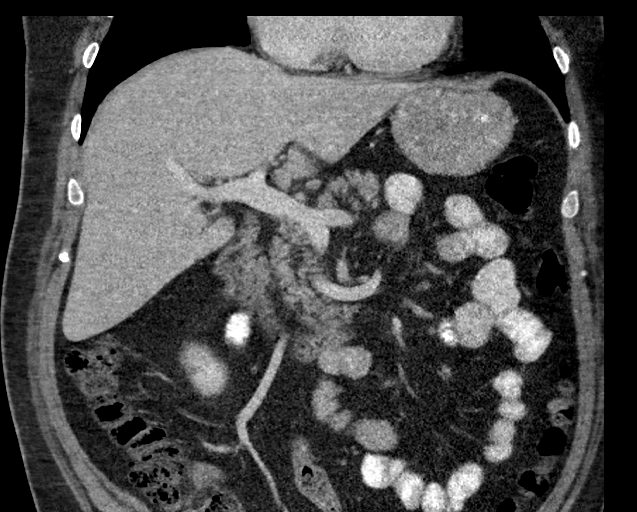
[im 68/123  soft-tissue]
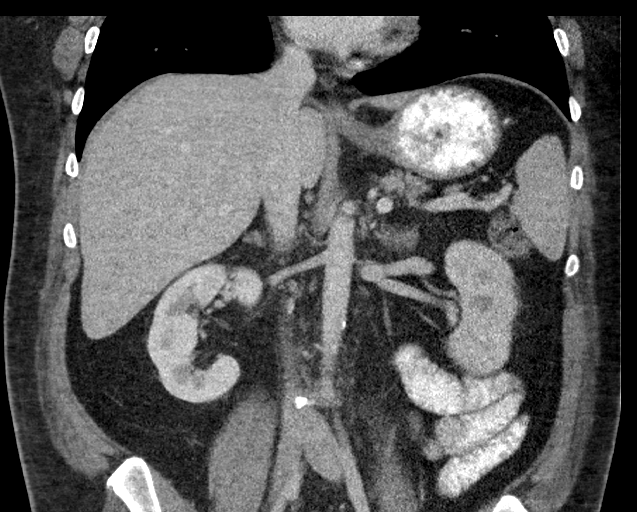

[16 of 46 positions shown; findings below may reference images not displayed]

FINDINGS: Lower chest: Unremarkable

Hepatobiliary: Unremarkable. No biliary dilatation. The gallbladder
appears normal.

Pancreas: Unremarkable

Spleen: Unremarkable

Adrenals/Urinary Tract: 1.1 by 0.7 by 0.8 cm hypodense lesion of the
left kidney upper pole is technically too small to characterize
although statistically likely to be a small cyst. Similarly based on
size and configuration, small hypodensities in the right mid kidney
and right kidney lower pole no upper urinary tract calculi
identified. Aside from the suspected cysts, no significant
heterogeneity in renal enhancement is identified. Are technically
too small to characterize although probably cysts. Both adrenal
glands appear normal.

Stomach/Bowel: Unremarkable

Vascular/Lymphatic: Mild aortoiliac atherosclerotic vascular
disease.

Other: No supplemental non-categorized findings.

Musculoskeletal: Mild bridging spurring of the upper portion of the
sacroiliac joints bilaterally. Lower lumbar spondylosis.
IMPRESSION: 1. A specific cause for the patient's right upper quadrant pain is
not identified on today's examination.
2. Hypodense lesions in both kidneys are technically too small to
characterize although statistically likely to be cysts.
3. Mild aortoiliac atherosclerotic vascular disease.
4. Mild bridging spurring of the upper portions of the sacroiliac
joints.
5. Lower lumbar spondylosis.

## 2022-10-26 ENCOUNTER — Other Ambulatory Visit: Payer: Self-pay | Admitting: Family Medicine

## 2022-10-26 DIAGNOSIS — F320 Major depressive disorder, single episode, mild: Secondary | ICD-10-CM

## 2022-11-01 ENCOUNTER — Telehealth: Payer: Self-pay | Admitting: Pharmacist

## 2022-11-01 ENCOUNTER — Ambulatory Visit (INDEPENDENT_AMBULATORY_CARE_PROVIDER_SITE_OTHER): Payer: Commercial Managed Care - PPO | Admitting: Family Medicine

## 2022-11-01 ENCOUNTER — Telehealth: Payer: Self-pay

## 2022-11-01 VITALS — BP 139/90 | HR 63 | Temp 98.2°F | Ht 71.0 in | Wt 314.8 lb

## 2022-11-01 DIAGNOSIS — E782 Mixed hyperlipidemia: Secondary | ICD-10-CM

## 2022-11-01 DIAGNOSIS — F320 Major depressive disorder, single episode, mild: Secondary | ICD-10-CM

## 2022-11-01 DIAGNOSIS — Z6841 Body Mass Index (BMI) 40.0 and over, adult: Secondary | ICD-10-CM

## 2022-11-01 DIAGNOSIS — I10 Essential (primary) hypertension: Secondary | ICD-10-CM | POA: Diagnosis not present

## 2022-11-01 MED ORDER — ZEPBOUND 2.5 MG/0.5ML ~~LOC~~ SOAJ: 2.5000 mg | SUBCUTANEOUS | 0 refills | Status: DC

## 2022-11-01 MED ORDER — LISINOPRIL-HYDROCHLOROTHIAZIDE 20-12.5 MG PO TABS: 2.0000 | ORAL_TABLET | Freq: Every day | ORAL | 3 refills | Status: DC

## 2022-11-01 MED ORDER — FLUOXETINE HCL 40 MG PO CAPS: 80.0000 mg | ORAL_CAPSULE | Freq: Every day | ORAL | 3 refills | Status: DC

## 2022-11-01 MED ORDER — TIRZEPATIDE 2.5 MG/0.5ML ~~LOC~~ SOAJ: 2.5000 mg | SUBCUTANEOUS | 0 refills | Status: DC

## 2022-11-01 NOTE — Progress Notes (Signed)
Zepbound APPROVED BMI 43.91 Wt 314lb PT NEEDS F/U VISIT W/ PCP DOCUMENTING PROGRESS WITHIN 6 MONTHS OR LESS

## 2022-11-01 NOTE — Patient Instructions (Signed)
Medication sent.  Recommended labs: CBC, CMP, Lipid, A1C, TSH.  Take care  Dr. Adriana Simas

## 2022-11-01 NOTE — Telephone Encounter (Signed)
Called patient and informed patient his PA for Zepbound has been approved and has been sent to [facility] pharmacy in [place]. [facility] pharmacy and informed them patient's PA was approved.

## 2022-11-02 NOTE — Telephone Encounter (Signed)
Patient has been informed per provider notes and recommendations.

## 2022-11-04 NOTE — Assessment & Plan Note (Signed)
Fair control. Medication refilled. Needs weight loss.

## 2022-11-04 NOTE — Progress Notes (Signed)
Subjective:  Patient ID: Dakota Cohen, male    DOB: [birth year]  Age: 52 y.o. MRN: [id number]  CC:  Follow up   HPI:  52 year old male with Morbid obesity, HTN, HLD, Depression presents for follow up.  Patient has report of labs from work. He is prediabetic and LDL was 151. Recommended complete panel of labs - CBC, CMP, Lipid, A1C, TSH.  BP 139/90 today. Is currently on Lisinopril/hydrochlorothiazide. Needs refill.  Patient interested in starting medication for weight loss. Interested in [place].   Depression stable on Prozac. Needs refill. No longer taking Wellbutrin.   Patient Active Problem List   Diagnosis Date Noted   Morbid obesity (HCC) 11/04/2022   Mixed hyperlipidemia 10/30/2020   Essential hypertension 12/16/2019   Depression, major, single episode, mild (HCC) 12/16/2019    Social Hx   Social History   Socioeconomic History   Marital status: Married    Spouse name: Not on file   Number of children: Not on file   Years of education: Not on file   Highest education level: Associate degree: occupational, Scientist, product/process development, or vocational program  Occupational History   Not on file  Tobacco Use   Smoking status: Never   Smokeless tobacco: Never  Substance and Sexual Activity   Alcohol use: Not on file   Drug use: Not on file   Sexual activity: Not on file  Other Topics Concern   Not on file  Social History Narrative   Not on file   Social Determinants of Health   Financial Resource Strain: Patient Declined (10/31/2022)   Overall Financial Resource Strain (CARDIA)    Difficulty of Paying Living Expenses: Patient declined  Food Insecurity: Patient Declined (10/31/2022)   Hunger Vital Sign    Worried About Running Out of Food in the Last Year: Patient declined    Ran Out of Food in the Last Year: Patient declined  Transportation Needs: Patient Declined (10/31/2022)   PRAPARE - Administrator, Civil Service (Medical): Patient declined     Lack of Transportation (Non-Medical): Patient declined  Physical Activity: Insufficiently Active (10/31/2022)   Exercise Vital Sign    Days of Exercise per Week: 3 days    Minutes of Exercise per Session: 20 min  Stress: Patient Declined (10/31/2022)   [employer] of Occupational Health - Occupational Stress Questionnaire    Feeling of Stress : Patient declined  Social Connections: Unknown (10/31/2022)   Social Connection and Isolation Panel [NHANES]    Frequency of Communication with Friends and Family: Patient declined    Frequency of Social Gatherings with Friends and Family: Patient declined    Attends Religious Services: Patient declined    Database administrator or Organizations: Patient declined    Attends Banker Meetings: Not on file    Marital Status: Patient declined    Review of Systems  Constitutional:        Weight gain.  Respiratory: Negative.    Cardiovascular: Negative.    Objective:  BP (!) 139/90   Pulse 63   Temp 98.2 F (36.8 C)   Ht 5\' 11"  (1.803 m)   Wt (!) 314 lb 12.8 oz (142.8 kg)   SpO2 96%   BMI 43.91 kg/m      11/01/2022    1:08 PM 01/25/2022    2:12 PM 03/28/2021    8:25 AM  BP/Weight  Systolic BP 139 113 126  Diastolic BP 90 74 80  Wt. (Lbs) 314.8  301 297.2  BMI 43.91 kg/m2 41.98 kg/m2 41.45 kg/m2    Physical Exam Vitals reviewed.  Constitutional:      General: He is not in acute distress.    Appearance: Normal appearance. He is obese.  HENT:     Head: Normocephalic and atraumatic.  Cardiovascular:     Rate and Rhythm: Normal rate and regular rhythm.  Pulmonary:     Effort: Pulmonary effort is normal.     Breath sounds: Normal breath sounds.  Neurological:     Mental Status: He is alert.  Psychiatric:        Mood and Affect: Mood normal.        Behavior: Behavior normal.     Lab Results  Component Value Date   WBC 10.6 01/13/2021   HGB 14.9 01/13/2021   HCT 44.7 01/13/2021   PLT 288 01/13/2021    GLUCOSE 79 01/13/2021   ALT 48 (H) 01/13/2021   AST 28 01/13/2021   NA 139 01/13/2021   K 3.9 01/13/2021   CL 101 01/13/2021   CREATININE 1.00 02/15/2021   BUN 12 01/13/2021   CO2 22 01/13/2021     Assessment & Plan:   Problem List Items Addressed This Visit       Cardiovascular and Mediastinum   Essential hypertension - Primary    Fair control. Medication refilled. Needs weight loss.      Relevant Medications   lisinopril-hydrochlorothiazide (ZESTORETIC) 20-12.5 MG tablet     Other   Depression, major, single episode, mild (HCC)    Stable. Continue Prozac. Refilled.       Relevant Medications   FLUoxetine (PROZAC) 40 MG capsule   Mixed hyperlipidemia    Discussed lipid lowering therapy. He wants to wait until follow up.      Relevant Medications   lisinopril-hydrochlorothiazide (ZESTORETIC) 20-12.5 MG tablet   Morbid obesity (HCC)    Starting Zepbound.       Relevant Medications   tirzepatide (ZEPBOUND) 2.5 MG/0.5ML Pen    Meds ordered this encounter  Medications   DISCONTD: tirzepatide (MOUNJARO) 2.5 MG/0.5ML Pen    Sig: Inject 2.5 mg into the skin once a week.    Dispense:  2 mL    Refill:  0   lisinopril-hydrochlorothiazide (ZESTORETIC) 20-12.5 MG tablet    Sig: Take 2 tablets by mouth daily.    Dispense:  180 tablet    Refill:  3   FLUoxetine (PROZAC) 40 MG capsule    Sig: Take 2 capsules (80 mg total) by mouth daily.    Dispense:  180 capsule    Refill:  3   tirzepatide (ZEPBOUND) 2.5 MG/0.5ML Pen    Sig: Inject 2.5 mg into the skin once a week.    Dispense:  2 mL    Refill:  0   Follow-up:  Later this year for physical  Everlene Other DO [facility]

## 2022-11-04 NOTE — Assessment & Plan Note (Signed)
Starting Zepbound. 

## 2022-11-04 NOTE — Assessment & Plan Note (Signed)
Discussed lipid lowering therapy. He wants to wait until follow up.

## 2022-11-04 NOTE — Assessment & Plan Note (Signed)
Stable. Continue Prozac. Refilled.

## 2022-11-23 ENCOUNTER — Other Ambulatory Visit: Payer: Self-pay | Admitting: Family Medicine

## 2022-11-26 ENCOUNTER — Other Ambulatory Visit: Payer: Self-pay | Admitting: Family Medicine

## 2022-11-26 ENCOUNTER — Encounter: Payer: Self-pay | Admitting: Family Medicine

## 2022-11-26 MED ORDER — TIRZEPATIDE-WEIGHT MANAGEMENT 12.5 MG/0.5ML ~~LOC~~ SOAJ: 12.5000 mg | SUBCUTANEOUS | 0 refills | Status: DC

## 2022-11-26 MED ORDER — TIRZEPATIDE-WEIGHT MANAGEMENT 10 MG/0.5ML ~~LOC~~ SOAJ: 10.0000 mg | SUBCUTANEOUS | 0 refills | Status: DC

## 2022-11-26 MED ORDER — TIRZEPATIDE-WEIGHT MANAGEMENT 5 MG/0.5ML ~~LOC~~ SOAJ: 5.0000 mg | SUBCUTANEOUS | 0 refills | Status: DC

## 2022-11-26 MED ORDER — TIRZEPATIDE-WEIGHT MANAGEMENT 15 MG/0.5ML ~~LOC~~ SOAJ: 15.0000 mg | SUBCUTANEOUS | 1 refills | Status: DC

## 2022-11-26 MED ORDER — TIRZEPATIDE-WEIGHT MANAGEMENT 7.5 MG/0.5ML ~~LOC~~ SOAJ: 7.5000 mg | SUBCUTANEOUS | 0 refills | Status: DC

## 2022-11-28 ENCOUNTER — Telehealth: Payer: Self-pay

## 2022-11-28 NOTE — Telephone Encounter (Signed)
Pt said insurance not fixed for [employer] put in and said needed pro authorization  Call back [phone]

## 2022-11-29 NOTE — Telephone Encounter (Signed)
Patient informed that the 5 mg is going through and will be filled by the pharmacy

## 2023-01-03 LAB — LAB REPORT - SCANNED
A1c: 5.5
EGFR: 103

## 2023-01-11 ENCOUNTER — Other Ambulatory Visit: Payer: Self-pay | Admitting: Family Medicine

## 2023-01-11 DIAGNOSIS — Z1212 Encounter for screening for malignant neoplasm of rectum: Secondary | ICD-10-CM

## 2023-01-11 DIAGNOSIS — Z1211 Encounter for screening for malignant neoplasm of colon: Secondary | ICD-10-CM

## 2023-01-14 NOTE — Telephone Encounter (Signed)
Called and messaged patient regarding next zepbound dose .

## 2023-01-15 ENCOUNTER — Other Ambulatory Visit: Payer: Self-pay

## 2023-01-29 ENCOUNTER — Ambulatory Visit: Payer: Commercial Managed Care - PPO | Admitting: Family Medicine

## 2023-01-29 VITALS — BP 112/77 | HR 78 | Temp 98.1°F | Ht 71.0 in | Wt 290.6 lb

## 2023-01-29 DIAGNOSIS — Z0000 Encounter for general adult medical examination without abnormal findings: Secondary | ICD-10-CM | POA: Diagnosis not present

## 2023-01-29 NOTE — Assessment & Plan Note (Signed)
Doing well.  Patient brought in labs for review today.  Mildly elevated lipids and mildly elevated liver enzyme.  I am optimistic that this will improve with continued weight loss.  He is losing weight with Zepbound.  We will continue.  Preventative health care updated today.  He declines HIV and hepatitis C screening.  Declines additional vaccines.  He has already had his flu vaccine.

## 2023-01-29 NOTE — Patient Instructions (Signed)
Continue going up on Zepbound.  Follow up in 6 months to 1 year.

## 2023-01-29 NOTE — Progress Notes (Signed)
Subjective:  Patient ID: Dakota Cohen, male    DOB: 1970/05/02  Age: 52 y.o. MRN: [id number]  CC: Annual exam   HPI:  52 year old male presents for an annual exam.  Patient is overall doing well.  He has lost 24 pounds since his last visit.  He is tolerating Zepbound and is currently on 10 mg weekly.  No reported side effects.  Blood pressure stable on lisinopril/ACTZ.  Patient got his flu vaccine last week.  Declines any other vaccines at this time.  Cologuard has been ordered.  He is going to complete this.  Declines HIV and hepatitis C screening.  Patient Active Problem List   Diagnosis Date Noted   Annual physical exam 01/29/2023   Morbid obesity (HCC) 11/04/2022   Mixed hyperlipidemia 10/30/2020   Essential hypertension 12/16/2019   Depression, major, single episode, mild (HCC) 12/16/2019    Social Hx   Social History   Socioeconomic History   Marital status: Married    Spouse name: Not on file   Number of children: Not on file   Years of education: Not on file   Highest education level: Associate degree: occupational, Scientist, product/process development, or vocational program  Occupational History   Not on file  Tobacco Use   Smoking status: Never   Smokeless tobacco: Never  Substance and Sexual Activity   Alcohol use: Not on file   Drug use: Not on file   Sexual activity: Not on file  Other Topics Concern   Not on file  Social History Narrative   Not on file   Social Determinants of Health   Financial Resource Strain: Patient Declined (10/31/2022)   Overall Financial Resource Strain (CARDIA)    Difficulty of Paying Living Expenses: Patient declined  Food Insecurity: Patient Declined (10/31/2022)   Hunger Vital Sign    Worried About Running Out of Food in the Last Year: Patient declined    Ran Out of Food in the Last Year: Patient declined  Transportation Needs: Patient Declined (10/31/2022)   PRAPARE - Administrator, Civil Service (Medical): Patient declined     Lack of Transportation (Non-Medical): Patient declined  Physical Activity: Insufficiently Active (10/31/2022)   Exercise Vital Sign    Days of Exercise per Week: 3 days    Minutes of Exercise per Session: 20 min  Stress: Patient Declined (10/31/2022)   [employer] of Occupational Health - Occupational Stress Questionnaire    Feeling of Stress : Patient declined  Social Connections: Unknown (10/31/2022)   Social Connection and Isolation Panel [NHANES]    Frequency of Communication with Friends and Family: Patient declined    Frequency of Social Gatherings with Friends and Family: Patient declined    Attends Religious Services: Patient declined    Database administrator or Organizations: Patient declined    Attends Banker Meetings: Not on file    Marital Status: Patient declined    Review of Systems  Respiratory: Negative.    Cardiovascular: Negative.      Objective:  BP 112/77   Pulse 78   Temp 98.1 F (36.7 C)   Ht 5\' 11"  (1.803 m)   Wt 290 lb 9.6 oz (131.8 kg)   SpO2 97%   BMI 40.53 kg/m      01/29/2023    9:05 AM 11/01/2022    1:08 PM 01/25/2022    2:12 PM  BP/Weight  Systolic BP 112 139 113  Diastolic BP 77 90 74  Wt. (Lbs) 290.6  314.8 301  BMI 40.53 kg/m2 43.91 kg/m2 41.98 kg/m2    Physical Exam Vitals and nursing note reviewed.  Constitutional:      General: He is not in acute distress.    Appearance: Normal appearance. He is obese.  HENT:     Head: Normocephalic and atraumatic.  Eyes:     General:        Right eye: No discharge.        Left eye: No discharge.     Conjunctiva/sclera: Conjunctivae normal.  Cardiovascular:     Rate and Rhythm: Normal rate and regular rhythm.  Pulmonary:     Effort: Pulmonary effort is normal.     Breath sounds: Normal breath sounds. No wheezing, rhonchi or rales.  Neurological:     Mental Status: He is alert.  Psychiatric:        Mood and Affect: Mood normal.        Behavior: Behavior normal.      Lab Results  Component Value Date   WBC 10.6 01/13/2021   HGB 14.9 01/13/2021   HCT 44.7 01/13/2021   PLT 288 01/13/2021   GLUCOSE 79 01/13/2021   ALT 48 (H) 01/13/2021   AST 28 01/13/2021   NA 139 01/13/2021   K 3.9 01/13/2021   CL 101 01/13/2021   CREATININE 1.00 02/15/2021   BUN 12 01/13/2021   CO2 22 01/13/2021     Assessment & Plan:   Problem List Items Addressed This Visit       Other   Annual physical exam - Primary    Doing well.  Patient brought in labs for review today.  Mildly elevated lipids and mildly elevated liver enzyme.  I am optimistic that this will improve with continued weight loss.  He is losing weight with Zepbound.  We will continue.  Preventative health care updated today.  He declines HIV and hepatitis C screening.  Declines additional vaccines.  He has already had his flu vaccine.      Follow-up:  6 months to 1 year  Taren Toops DO [facility]

## 2023-02-15 ENCOUNTER — Encounter: Payer: Self-pay | Admitting: Family Medicine

## 2023-03-08 ENCOUNTER — Encounter: Payer: Self-pay | Admitting: Family Medicine

## 2023-04-10 ENCOUNTER — Other Ambulatory Visit: Payer: Self-pay | Admitting: Family Medicine

## 2023-04-27 ENCOUNTER — Other Ambulatory Visit: Payer: Self-pay | Admitting: Family Medicine

## 2023-05-06 ENCOUNTER — Other Ambulatory Visit: Payer: Self-pay | Admitting: Family Medicine

## 2023-06-11 ENCOUNTER — Telehealth: Payer: Self-pay | Admitting: Pharmacist

## 2023-06-27 ENCOUNTER — Other Ambulatory Visit: Payer: Self-pay | Admitting: Family Medicine

## 2023-07-04 ENCOUNTER — Ambulatory Visit: Admitting: Family Medicine

## 2023-07-04 DIAGNOSIS — I10 Essential (primary) hypertension: Secondary | ICD-10-CM | POA: Diagnosis not present

## 2023-07-04 DIAGNOSIS — Z6837 Body mass index (BMI) 37.0-37.9, adult: Secondary | ICD-10-CM

## 2023-07-04 MED ORDER — LISINOPRIL-HYDROCHLOROTHIAZIDE 20-12.5 MG PO TABS
1.0000 | ORAL_TABLET | Freq: Every day | ORAL | 3 refills | Status: DC
Start: 2023-07-04 — End: 2023-12-25

## 2023-07-04 MED ORDER — ZEPBOUND 15 MG/0.5ML ~~LOC~~ SOAJ: 15.0000 mg | SUBCUTANEOUS | 1 refills | Status: DC

## 2023-07-04 NOTE — Patient Instructions (Signed)
 Decrease blood pressure medication to 1 pill a day.  Zepbound refilled.  Follow-up in 6 months.

## 2023-07-05 NOTE — Progress Notes (Signed)
 Subjective:  Patient ID: Dakota Cohen, male    DOB: 08/17/1970  Age: 53 y.o. MRN: [id number]  CC:   Chief Complaint  Patient presents with   medication refills     HPI:  53 year old male presents for follow-up.  Patient is doing quite well on Zepbound.  His weight has went from 3 14-2 69.  He is pleased with the progress.  He needs refill on Zepbound today.  Blood pressure well-controlled.  Will discuss decreasing his medication today given significant weight loss.  Patient Active Problem List   Diagnosis Date Noted   Morbid obesity (HCC) 11/04/2022   Mixed hyperlipidemia 10/30/2020   Essential hypertension 12/16/2019   Depression, major, single episode, mild (HCC) 12/16/2019    Social Hx   Social History   Socioeconomic History   Marital status: Married    Spouse name: Not on file   Number of children: Not on file   Years of education: Not on file   Highest education level: Associate degree: occupational, Scientist, product/process development, or vocational program  Occupational History   Not on file  Tobacco Use   Smoking status: Never   Smokeless tobacco: Never  Substance and Sexual Activity   Alcohol use: Not on file   Drug use: Not on file   Sexual activity: Not on file  Other Topics Concern   Not on file  Social History Narrative   Not on file   Social Drivers of Health   Financial Resource Strain: Patient Declined (10/31/2022)   Overall Financial Resource Strain (CARDIA)    Difficulty of Paying Living Expenses: Patient declined  Food Insecurity: Patient Declined (10/31/2022)   Hunger Vital Sign    Worried About Running Out of Food in the Last Year: Patient declined    Ran Out of Food in the Last Year: Patient declined  Transportation Needs: Patient Declined (10/31/2022)   PRAPARE - Administrator, Civil Service (Medical): Patient declined    Lack of Transportation (Non-Medical): Patient declined  Physical Activity: Insufficiently Active (10/31/2022)   Exercise  Vital Sign    Days of Exercise per Week: 3 days    Minutes of Exercise per Session: 20 min  Stress: Patient Declined (10/31/2022)   [employer] of Occupational Health - Occupational Stress Questionnaire    Feeling of Stress : Patient declined  Social Connections: Unknown (10/31/2022)   Social Connection and Isolation Panel [NHANES]    Frequency of Communication with Friends and Family: Patient declined    Frequency of Social Gatherings with Friends and Family: Patient declined    Attends Religious Services: Patient declined    Database administrator or Organizations: Patient declined    Attends Engineer, structural: Not on file    Marital Status: Patient declined    Review of Systems Per HPI  Objective:  BP 108/68   Pulse 81   Temp 98.8 F (37.1 C)   Ht 5\' 11"  (1.803 m)   Wt 269 lb (122 kg)   SpO2 98%   BMI 37.52 kg/m      07/04/2023    1:40 PM 01/29/2023    9:05 AM 11/01/2022    1:08 PM  BP/Weight  Systolic BP 108 112 139  Diastolic BP 68 77 90  Wt. (Lbs) 269 290.6 314.8  BMI 37.52 kg/m2 40.53 kg/m2 43.91 kg/m2    Physical Exam Vitals and nursing note reviewed.  Constitutional:      General: He is not in acute distress.  Appearance: Normal appearance.  HENT:     Head: Normocephalic and atraumatic.  Eyes:     General:        Right eye: No discharge.        Left eye: No discharge.     Conjunctiva/sclera: Conjunctivae normal.  Cardiovascular:     Rate and Rhythm: Normal rate and regular rhythm.  Pulmonary:     Effort: Pulmonary effort is normal.     Breath sounds: Normal breath sounds. No wheezing, rhonchi or rales.  Neurological:     Mental Status: He is alert.  Psychiatric:        Mood and Affect: Mood normal.        Behavior: Behavior normal.     Lab Results  Component Value Date   WBC 10.6 01/13/2021   HGB 14.9 01/13/2021   HCT 44.7 01/13/2021   PLT 288 01/13/2021   GLUCOSE 79 01/13/2021   ALT 48 (H) 01/13/2021   AST 28  01/13/2021   NA 139 01/13/2021   K 3.9 01/13/2021   CL 101 01/13/2021   CREATININE 1.00 02/15/2021   BUN 12 01/13/2021   CO2 22 01/13/2021     Assessment & Plan:  Morbid obesity (HCC) Assessment & Plan: Patient is doing very well.  He has lost a tremendous amount of weight.  Continue Zepbound.   Essential hypertension Assessment & Plan: Well-controlled.  Given significant weight loss and his blood pressure reading today, decreasing lisinopril/HCTZ to just 1 tablet a day.  Orders: -     Lisinopril-hydroCHLOROthiazide; Take 1 tablet by mouth daily.  Dispense: 90 tablet; Refill: 3  Other orders -     Zepbound; Inject 15 mg into the skin once a week.  Dispense: 6 mL; Refill: 1   Follow-up: 6 months  Krithi Bray Adriana Simas DO [facility]

## 2023-07-05 NOTE — Assessment & Plan Note (Signed)
 Patient is doing very well.  He has lost a tremendous amount of weight.  Continue Zepbound.

## 2023-07-05 NOTE — Assessment & Plan Note (Signed)
 Well-controlled.  Given significant weight loss and his blood pressure reading today, decreasing lisinopril/HCTZ to just 1 tablet a day.

## 2023-10-09 ENCOUNTER — Telehealth: Payer: Self-pay | Admitting: Pharmacy Technician

## 2023-10-09 ENCOUNTER — Other Ambulatory Visit (HOSPITAL_COMMUNITY): Payer: Self-pay

## 2023-10-09 NOTE — Telephone Encounter (Signed)
 Pharmacy Patient Advocate Encounter   Received notification from CoverMyMeds that prior authorization for Zepbound  15MG /0.5ML pen-injectors is required/requested.   Insurance verification completed.   The patient is insured through [employer] .    Have patient contact health plan to inquire about how to enroll. Once enrollment is complete he will need to reach back out to let us  know so that we can try and resubmit the PA at that time.

## 2023-10-14 ENCOUNTER — Other Ambulatory Visit (HOSPITAL_COMMUNITY): Payer: Self-pay

## 2023-10-15 ENCOUNTER — Other Ambulatory Visit (HOSPITAL_COMMUNITY): Payer: Self-pay

## 2023-10-17 ENCOUNTER — Other Ambulatory Visit (HOSPITAL_COMMUNITY): Payer: Self-pay

## 2023-10-17 NOTE — Telephone Encounter (Signed)
 We are still receiving notification that he needs to enroll in [place] virtual care. Please have patient reach back out to his insurance to make sure he has enrolled into [place]. Thank you.

## 2023-10-18 ENCOUNTER — Other Ambulatory Visit (HOSPITAL_COMMUNITY): Payer: Self-pay

## 2023-10-18 NOTE — Telephone Encounter (Signed)
 Spoke with patient and informed he must contact [facility] , his insurance company in order to get signed up with [facility].

## 2023-10-21 ENCOUNTER — Other Ambulatory Visit (HOSPITAL_COMMUNITY): Payer: Self-pay

## 2023-10-22 ENCOUNTER — Other Ambulatory Visit: Payer: Self-pay

## 2023-10-22 ENCOUNTER — Other Ambulatory Visit (HOSPITAL_COMMUNITY): Payer: Self-pay

## 2023-10-22 MED ORDER — ZEPBOUND 15 MG/0.5ML ~~LOC~~ SOAJ: 15.0000 mg | SUBCUTANEOUS | 1 refills | Status: DC

## 2023-10-22 NOTE — Telephone Encounter (Signed)
 Pharmacy Patient Advocate Encounter   Received notification from CoverMyMeds that prior authorization for Zepbound  15mg /0.62ml auto-injectors is required/requested.   Insurance verification completed.   The patient is insured through [employer] .   Per test claim: PA required; PA submitted to above mentioned insurance via LATENT Key/confirmation #/EOC [id number] Status is pending

## 2023-10-23 ENCOUNTER — Telehealth: Payer: Self-pay

## 2023-10-23 NOTE — Telephone Encounter (Signed)
 Communication  Reason for CRM: Patient received call from [facility] pharmacy and informed a prior authorization would be needed for the zepbound . Patient very frustrated as he has called a few times and has had to call the pharmacy as well and still has not been able to get prescription.        Patient requesting this be expedited as patient will be leaving Saturday to go out of town.

## 2023-10-25 NOTE — Telephone Encounter (Signed)
 Hi Ms Rock,  [place] just following up on zepbound  PA for the patient , the pharmacy doesn't have it ready for him. He says he is also going out of town tomorrow , thanks

## 2023-10-28 ENCOUNTER — Telehealth: Payer: Self-pay

## 2023-10-28 ENCOUNTER — Other Ambulatory Visit (HOSPITAL_COMMUNITY): Payer: Self-pay

## 2023-10-28 NOTE — Telephone Encounter (Signed)
 Checking on the prior auth for patients Zepbound . Patient has been without medication for 3 weeks now. Seeing if there has been any updates.   Thanks!

## 2023-10-29 ENCOUNTER — Other Ambulatory Visit (HOSPITAL_COMMUNITY): Payer: Self-pay

## 2023-10-29 NOTE — Telephone Encounter (Signed)
Message has bee relayed to patient.

## 2023-10-29 NOTE — Telephone Encounter (Signed)
 PA request has been Approved. New Encounter has been or will be created for follow up. For additional info see Pharmacy Prior Auth telephone encounter from 10/09/2023.  Sorry for the delay. [facility] is taking up to 14 days to review a standard request. I called yesterday and had them change this one to an expedited request.

## 2023-10-29 NOTE — Telephone Encounter (Signed)
 Pharmacy Patient Advocate Encounter  Received notification from [facility] that Prior Authorization for  Zepbound  15mg /0.30ml auto-injectors  has been APPROVED from 10/28/2023 to 10/27/2024. Ran test claim, Copay is $24.99. This test claim was processed through [facility]- copay amounts may vary at other pharmacies due to pharmacy/plan contracts, or as the patient moves through the different stages of their insurance plan.   PA #/Case ID/Reference #: [id number]

## 2023-12-25 ENCOUNTER — Other Ambulatory Visit: Payer: Self-pay | Admitting: Family Medicine

## 2023-12-25 DIAGNOSIS — I10 Essential (primary) hypertension: Secondary | ICD-10-CM

## 2023-12-26 ENCOUNTER — Telehealth: Payer: Self-pay

## 2023-12-26 NOTE — Telephone Encounter (Signed)
 Copied from CRM [id number]. Topic: Clinical - Prescription Issue >> Dec 26, 2023  9:24 AM Turkey A wrote: Reason for CRM: Patient would like a call back because there are no refills on lisinopril -hydrochlorothiazide  (ZESTORETIC ) 20-12.5 MG tablet and patient states he was in in April and has appt in October (01/06/24) and said there should not be an issues. Please Contact

## 2024-01-01 ENCOUNTER — Other Ambulatory Visit: Payer: Self-pay | Admitting: Family Medicine

## 2024-01-01 DIAGNOSIS — I10 Essential (primary) hypertension: Secondary | ICD-10-CM

## 2024-01-01 MED ORDER — LISINOPRIL-HYDROCHLOROTHIAZIDE 20-12.5 MG PO TABS
2.0000 | ORAL_TABLET | Freq: Every day | ORAL | 3 refills | Status: DC
Start: 2024-01-01 — End: 2024-01-06

## 2024-01-06 ENCOUNTER — Encounter: Payer: Self-pay | Admitting: Family Medicine

## 2024-01-06 ENCOUNTER — Ambulatory Visit (INDEPENDENT_AMBULATORY_CARE_PROVIDER_SITE_OTHER): Admitting: Family Medicine

## 2024-01-06 VITALS — BP 106/68 | HR 76 | Ht 72.0 in | Wt 271.0 lb

## 2024-01-06 DIAGNOSIS — I10 Essential (primary) hypertension: Secondary | ICD-10-CM

## 2024-01-06 DIAGNOSIS — Z1211 Encounter for screening for malignant neoplasm of colon: Secondary | ICD-10-CM

## 2024-01-06 DIAGNOSIS — Z23 Encounter for immunization: Secondary | ICD-10-CM | POA: Diagnosis not present

## 2024-01-06 DIAGNOSIS — E782 Mixed hyperlipidemia: Secondary | ICD-10-CM

## 2024-01-06 MED ORDER — HYDROCHLOROTHIAZIDE 25 MG PO TABS: 25.0000 mg | ORAL_TABLET | Freq: Every day | ORAL | 3 refills | Status: DC

## 2024-01-06 MED ORDER — ZEPBOUND 15 MG/0.5ML ~~LOC~~ SOAJ: 15.0000 mg | SUBCUTANEOUS | 4 refills | Status: DC

## 2024-01-06 MED ORDER — HYDROCHLOROTHIAZIDE 12.5 MG PO CAPS: 12.5000 mg | ORAL_CAPSULE | Freq: Every day | ORAL | 3 refills | Status: AC

## 2024-01-06 NOTE — Assessment & Plan Note (Signed)
 Well-controlled.  HCTZ sent in.

## 2024-01-06 NOTE — Progress Notes (Signed)
 Subjective:  Patient ID: Dakota Cohen, male    DOB: 09-29-1970  Age: 53 y.o. MRN: [id number]  CC:   Chief Complaint  Patient presents with   Hypertension    Six month follow up    HPI:  53 year old male presents for follow-up.  Blood pressure is well-controlled.  He has been out of his medication.  Patient states that he would like to restart HCTZ due to fluid retention.  Will discuss today.  Patient doing well on Zepbound .  He would like to continue.    Patient due for flu vaccine, pneumococcal vaccine, shingles vaccine.  He is amenable to pneumococcal and flu vaccine today.  Patient did not complete Cologuard.  He is okay with me reordering.  Patient needs labs.  However, he still gets his labs through his former workplace at no cost.  Advised to get labs.  Patient Active Problem List   Diagnosis Date Noted   Morbid obesity (HCC) 11/04/2022   Mixed hyperlipidemia 10/30/2020   Essential hypertension 12/16/2019    Social Hx   Social History   Socioeconomic History   Marital status: Married    Spouse name: Not on file   Number of children: Not on file   Years of education: Not on file   Highest education level: Associate degree: occupational, Scientist, product/process development, or vocational program  Occupational History   Not on file  Tobacco Use   Smoking status: Never   Smokeless tobacco: Never  Substance and Sexual Activity   Alcohol use: Not on file   Drug use: Not on file   Sexual activity: Not on file  Other Topics Concern   Not on file  Social History Narrative   Not on file   Social Drivers of Health   Financial Resource Strain: Patient Declined (10/31/2022)   Overall Financial Resource Strain (CARDIA)    Difficulty of Paying Living Expenses: Patient declined  Food Insecurity: Patient Declined (10/31/2022)   Hunger Vital Sign    Worried About Running Out of Food in the Last Year: Patient declined    Ran Out of Food in the Last Year: Patient declined  Transportation  Needs: Patient Declined (10/31/2022)   PRAPARE - Administrator, Civil Service (Medical): Patient declined    Lack of Transportation (Non-Medical): Patient declined  Physical Activity: Insufficiently Active (10/31/2022)   Exercise Vital Sign    Days of Exercise per Week: 3 days    Minutes of Exercise per Session: 20 min  Stress: Patient Declined (10/31/2022)   [employer] of Occupational Health - Occupational Stress Questionnaire    Feeling of Stress : Patient declined  Social Connections: Unknown (10/31/2022)   Social Connection and Isolation Panel    Frequency of Communication with Friends and Family: Patient declined    Frequency of Social Gatherings with Friends and Family: Patient declined    Attends Religious Services: Patient declined    Database administrator or Organizations: Patient declined    Attends Banker Meetings: Not on file    Marital Status: Patient declined    Review of Systems  Respiratory: Negative.    Cardiovascular: Negative.     Objective:  BP 106/68   Pulse 76   Ht 6' (1.829 m)   Wt 271 lb (122.9 kg)   SpO2 96%   BMI 36.75 kg/m      01/06/2024    8:33 AM 07/04/2023    1:40 PM 01/29/2023    9:05 AM  BP/Weight  Systolic BP 106 108 112  Diastolic BP 68 68 77  Wt. (Lbs) 271 269 290.6  BMI 36.75 kg/m2 37.52 kg/m2 40.53 kg/m2    Physical Exam Vitals and nursing note reviewed.  Constitutional:      General: He is not in acute distress.    Appearance: Normal appearance.  HENT:     Head: Normocephalic and atraumatic.  Eyes:     General:        Right eye: No discharge.        Left eye: No discharge.     Conjunctiva/sclera: Conjunctivae normal.  Cardiovascular:     Rate and Rhythm: Normal rate and regular rhythm.  Pulmonary:     Effort: Pulmonary effort is normal.     Breath sounds: Normal breath sounds. No wheezing, rhonchi or rales.  Neurological:     Mental Status: He is alert.  Psychiatric:        Mood and  Affect: Mood normal.        Behavior: Behavior normal.     Lab Results  Component Value Date   WBC 10.6 01/13/2021   HGB 14.9 01/13/2021   HCT 44.7 01/13/2021   PLT 288 01/13/2021   GLUCOSE 79 01/13/2021   ALT 48 (H) 01/13/2021   AST 28 01/13/2021   NA 139 01/13/2021   K 3.9 01/13/2021   CL 101 01/13/2021   CREATININE 1.00 02/15/2021   BUN 12 01/13/2021   CO2 22 01/13/2021     Assessment & Plan:  Essential hypertension Assessment & Plan: Well-controlled.  HCTZ sent in.  Orders: -     hydroCHLOROthiazide ; Take 1 capsule (12.5 mg total) by mouth daily.  Dispense: 90 capsule; Refill: 3  Colon cancer screening -     Cologuard  Encounter for immunization -     Pneumococcal conjugate vaccine 20-valent -     Flu vaccine trivalent PF, 6mos and older(Flulaval,Afluria,Fluarix,Fluzone)  Morbid obesity (HCC) Assessment & Plan: Doing well on Zepbound .  Continue.  Rx sent in.  Orders: -     Zepbound ; Inject 15 mg into the skin once a week.  Dispense: 6 mL; Refill: 4  Mixed hyperlipidemia Assessment & Plan: Advised patient to get labs.  Needs reassessment.     Follow-up: 6 months  Jovee Dettinger Bluford DO [facility]

## 2024-01-06 NOTE — Assessment & Plan Note (Signed)
 Advised patient to get labs.  Needs reassessment.

## 2024-01-06 NOTE — Patient Instructions (Signed)
 Medications sent in.  Follow up in 6 months  Good luck on the Election.  Take care  Dr. Bluford

## 2024-01-06 NOTE — Assessment & Plan Note (Signed)
 Doing well on Zepbound .  Continue.  Rx sent in.

## 2024-01-31 LAB — COLOGUARD: COLOGUARD: NEGATIVE

## 2024-02-01 ENCOUNTER — Ambulatory Visit: Payer: Self-pay | Admitting: Family Medicine

## 2024-03-31 ENCOUNTER — Encounter: Payer: Self-pay | Admitting: Family Medicine

## 2024-04-03 ENCOUNTER — Other Ambulatory Visit: Payer: Self-pay

## 2024-04-06 ENCOUNTER — Other Ambulatory Visit: Payer: Self-pay | Admitting: Family Medicine

## 2024-04-06 ENCOUNTER — Other Ambulatory Visit: Payer: Self-pay

## 2024-04-06 MED ORDER — ZEPBOUND 15 MG/0.5ML ~~LOC~~ SOAJ: 15.0000 mg | SUBCUTANEOUS | 4 refills | Status: DC

## 2024-04-06 MED ORDER — ZEPBOUND 15 MG/0.5ML ~~LOC~~ SOAJ: 15.0000 mg | SUBCUTANEOUS | 4 refills | Status: AC

## 2024-04-30 ENCOUNTER — Ambulatory Visit: Payer: Self-pay | Admitting: Family Medicine

## 2024-04-30 NOTE — Telephone Encounter (Addendum)
 FYI Only or Action Required?: Action required by provider: Pt requesting script for CPAP mask replacement, to be sent to [facility].  Patient was last seen in primary care on 01/06/2024 by Cook, Jayce G, DO.  Called Nurse Triage reporting equipment issues.  Symptoms began yesterday.  Interventions attempted: Other: Pt spoke with [employer].  Symptoms are: stable.  Triage Disposition: Call PCP Now  Patient/caregiver understands and will follow disposition?: Yes   Rubber seal inside pts CPAP mask broke yesterday.  Pt spoke with the pharmacist at [facility] where he gets these mask replacements, was told he would need new rx for the mask only sent to the [facility] for insurance to cover it.  Pts states he qualifies for new mask every 6 months, has had the current one for 2 years. Has a Resmed CPAP machine. Got poor sleep last night going without CPAP. Forwarding request to office.     Copied from CRM #[id number]. Topic: Clinical - Medication Question >> Apr 30, 2024  9:36 AM Tonda B wrote: Reason for CRM: patient cpap machine is broken and in need in a new one air is coming out the seal is broken Reason for Disposition  Nursing judgment or information in reference  Answer Assessment - Initial Assessment Questions 1. REASON FOR CALL: What is your main concern right now?     CPAP Mask is broken  2. ONSET: When did the broken CPAP Mask start?     Yesterday  4. FUNCTIONAL IMPAIRMENT: How have things been going for you overall? Have you had more difficulty than usual doing your normal daily activities? (e.g., self-care, school, work, interactions)     Poor sleep  Protocols used: No Guideline Available-A-AH

## 2024-05-01 ENCOUNTER — Encounter: Payer: Self-pay | Admitting: Family Medicine

## 2024-05-01 ENCOUNTER — Telehealth: Payer: Self-pay

## 2024-05-01 NOTE — Telephone Encounter (Signed)
 Copied from CRM #[id number]. Topic: General - Other >> May 01, 2024  1:23 PM Victoria B wrote: Reason for CRM: Patient called in,states, needs a quote for prescription refill sent to [facility] for replacement CPAP mask. Patient  called [facility] yesterday. He has  had the CPAP device for 20 years and have been able to get replacements every six months for the mask but now they say they need a prescription refill in order to make that happen. Just need that code in today preferably as I haven't been able to sleep well in two days without it. The inner lining of the mass has worn out and it's torn and will no longer seal. He wants to know can someone else handle this besides Dr Bluford since he is out of the office today

## 2024-05-05 NOTE — Telephone Encounter (Signed)
 Faxed in to [facility] for cpap supplies , confirmation received

## 2024-05-13 ENCOUNTER — Other Ambulatory Visit (HOSPITAL_COMMUNITY): Payer: Self-pay
